# Patient Record
Sex: Female | Born: 1965 | Race: White | Hispanic: No | Marital: Married | Smoking: Former smoker
Health system: Southern US, Community
[De-identification: ages and names within clinical notes are randomized; demographics above are authoritative.]

## PROBLEM LIST (undated history)

## (undated) DIAGNOSIS — R51 Headache: Secondary | ICD-10-CM

## (undated) DIAGNOSIS — G43909 Migraine, unspecified, not intractable, without status migrainosus: Secondary | ICD-10-CM

## (undated) DIAGNOSIS — M1990 Unspecified osteoarthritis, unspecified site: Secondary | ICD-10-CM

## (undated) DIAGNOSIS — M419 Scoliosis, unspecified: Secondary | ICD-10-CM

## (undated) DIAGNOSIS — I10 Essential (primary) hypertension: Secondary | ICD-10-CM

## (undated) DIAGNOSIS — G4730 Sleep apnea, unspecified: Secondary | ICD-10-CM

## (undated) DIAGNOSIS — Z9889 Other specified postprocedural states: Secondary | ICD-10-CM

## (undated) DIAGNOSIS — N390 Urinary tract infection, site not specified: Secondary | ICD-10-CM

## (undated) DIAGNOSIS — E785 Hyperlipidemia, unspecified: Secondary | ICD-10-CM

## (undated) DIAGNOSIS — R519 Headache, unspecified: Secondary | ICD-10-CM

## (undated) DIAGNOSIS — B019 Varicella without complication: Secondary | ICD-10-CM

## (undated) DIAGNOSIS — E039 Hypothyroidism, unspecified: Secondary | ICD-10-CM

## (undated) DIAGNOSIS — R112 Nausea with vomiting, unspecified: Secondary | ICD-10-CM

## (undated) HISTORY — DX: Headache, unspecified: R51.9

## (undated) HISTORY — PX: TUBAL LIGATION: SHX77

## (undated) HISTORY — DX: Migraine, unspecified, not intractable, without status migrainosus: G43.909

## (undated) HISTORY — DX: Essential (primary) hypertension: I10

## (undated) HISTORY — PX: CHOLECYSTECTOMY: SHX55

## (undated) HISTORY — DX: Hyperlipidemia, unspecified: E78.5

## (undated) HISTORY — DX: Varicella without complication: B01.9

## (undated) HISTORY — DX: Headache: R51

## (undated) HISTORY — DX: Urinary tract infection, site not specified: N39.0

---

## 1998-09-10 HISTORY — PX: OTHER SURGICAL HISTORY: SHX169

## 2015-04-19 ENCOUNTER — Ambulatory Visit (INDEPENDENT_AMBULATORY_CARE_PROVIDER_SITE_OTHER): Payer: BLUE CROSS/BLUE SHIELD | Admitting: Family Medicine

## 2015-04-19 ENCOUNTER — Encounter: Payer: Self-pay | Admitting: Family Medicine

## 2015-04-19 ENCOUNTER — Encounter (INDEPENDENT_AMBULATORY_CARE_PROVIDER_SITE_OTHER): Payer: Self-pay

## 2015-04-19 VITALS — BP 110/82 | HR 73 | Temp 98.9°F | Ht 67.25 in | Wt 252.1 lb

## 2015-04-19 DIAGNOSIS — M1710 Unilateral primary osteoarthritis, unspecified knee: Secondary | ICD-10-CM | POA: Insufficient documentation

## 2015-04-19 DIAGNOSIS — E785 Hyperlipidemia, unspecified: Secondary | ICD-10-CM | POA: Insufficient documentation

## 2015-04-19 DIAGNOSIS — I10 Essential (primary) hypertension: Secondary | ICD-10-CM | POA: Diagnosis not present

## 2015-04-19 DIAGNOSIS — E039 Hypothyroidism, unspecified: Secondary | ICD-10-CM | POA: Diagnosis not present

## 2015-04-19 DIAGNOSIS — M170 Bilateral primary osteoarthritis of knee: Secondary | ICD-10-CM

## 2015-04-19 DIAGNOSIS — Z23 Encounter for immunization: Secondary | ICD-10-CM | POA: Diagnosis not present

## 2015-04-19 DIAGNOSIS — E669 Obesity, unspecified: Secondary | ICD-10-CM

## 2015-04-19 DIAGNOSIS — Z0000 Encounter for general adult medical examination without abnormal findings: Secondary | ICD-10-CM | POA: Insufficient documentation

## 2015-04-19 DIAGNOSIS — M179 Osteoarthritis of knee, unspecified: Secondary | ICD-10-CM | POA: Insufficient documentation

## 2015-04-19 DIAGNOSIS — N920 Excessive and frequent menstruation with regular cycle: Secondary | ICD-10-CM | POA: Diagnosis not present

## 2015-04-19 MED ORDER — ZOSTER VACCINE LIVE 19400 UNT/0.65ML ~~LOC~~ SOLR: 0.6500 mL | Freq: Once | SUBCUTANEOUS | Status: DC

## 2015-04-19 NOTE — Progress Notes (Signed)
Pre visit review using our clinic review tool, if applicable. No additional management support is needed unless otherwise documented below in the visit note. 

## 2015-04-19 NOTE — Patient Instructions (Addendum)
It was nice to see you today.  We will call with the results of your labs.  Continue your current medications.  Be cautious regarding long term use of Celebrex.  Be sure to follow up with me in 6 months.  I will place a referral to Gyn.  Take care  Dr. Lacinda Axon  Health Maintenance Adopting a healthy lifestyle and getting preventive care can go a long way to promote health and wellness. Talk with your health care provider about what schedule of regular examinations is right for you. This is a good chance for you to check in with your provider about disease prevention and staying healthy. In between checkups, there are plenty of things you can do on your own. Experts have done a lot of research about which lifestyle changes and preventive measures are most likely to keep you healthy. Ask your health care provider for more information. WEIGHT AND DIET  Eat a healthy diet  Be sure to include plenty of vegetables, fruits, low-fat dairy products, and lean protein.  Do not eat a lot of foods high in solid fats, added sugars, or salt.  Get regular exercise. This is one of the most important things you can do for your health.  Most adults should exercise for at least 150 minutes each week. The exercise should increase your heart rate and make you sweat (moderate-intensity exercise).  Most adults should also do strengthening exercises at least twice a week. This is in addition to the moderate-intensity exercise.  Maintain a healthy weight  Body mass index (BMI) is a measurement that can be used to identify possible weight problems. It estimates body fat based on height and weight. Your health care provider can help determine your BMI and help you achieve or maintain a healthy weight.  For females 14 years of age and older:   A BMI below 18.5 is considered underweight.  A BMI of 18.5 to 24.9 is normal.  A BMI of 25 to 29.9 is considered overweight.  A BMI of 30 and above is considered  obese.  Watch levels of cholesterol and blood lipids  You should start having your blood tested for lipids and cholesterol at 50 years of age, then have this test every 5 years.  You may need to have your cholesterol levels checked more often if:  Your lipid or cholesterol levels are high.  You are older than 50 years of age.  You are at high risk for heart disease.  CANCER SCREENING   Lung Cancer  Lung cancer screening is recommended for adults 70-72 years old who are at high risk for lung cancer because of a history of smoking.  A yearly low-dose CT scan of the lungs is recommended for people who:  Currently smoke.  Have quit within the past 15 years.  Have at least a 30-pack-year history of smoking. A pack year is smoking an average of one pack of cigarettes a day for 1 year.  Yearly screening should continue until it has been 15 years since you quit.  Yearly screening should stop if you develop a health problem that would prevent you from having lung cancer treatment.  Breast Cancer  Practice breast self-awareness. This means understanding how your breasts normally appear and feel.  It also means doing regular breast self-exams. Let your health care provider know about any changes, no matter how small.  If you are in your 20s or 30s, you should have a clinical breast exam (CBE) by a  a health care provider every 1-3 years as part of a regular health exam.  If you are 40 or older, have a CBE every year. Also consider having a breast X-ray (mammogram) every year.  If you have a family history of breast cancer, talk to your health care provider about genetic screening.  If you are at high risk for breast cancer, talk to your health care provider about having an MRI and a mammogram every year.  Breast cancer gene (BRCA) assessment is recommended for women who have family members with BRCA-related cancers. BRCA-related cancers  include:  Breast.  Ovarian.  Tubal.  Peritoneal cancers.  Results of the assessment will determine the need for genetic counseling and BRCA1 and BRCA2 testing. Cervical Cancer Routine pelvic examinations to screen for cervical cancer are no longer recommended for nonpregnant women who are considered low risk for cancer of the pelvic organs (ovaries, uterus, and vagina) and who do not have symptoms. A pelvic examination may be necessary if you have symptoms including those associated with pelvic infections. Ask your health care provider if a screening pelvic exam is right for you.   The Pap test is the screening test for cervical cancer for women who are considered at risk.  If you had a hysterectomy for a problem that was not cancer or a condition that could lead to cancer, then you no longer need Pap tests.  If you are older than 65 years, and you have had normal Pap tests for the past 10 years, you no longer need to have Pap tests.  If you have had past treatment for cervical cancer or a condition that could lead to cancer, you need Pap tests and screening for cancer for at least 20 years after your treatment.  If you no longer get a Pap test, assess your risk factors if they change (such as having a new sexual partner). This can affect whether you should start being screened again.  Some women have medical problems that increase their chance of getting cervical cancer. If this is the case for you, your health care provider may recommend more frequent screening and Pap tests.  The human papillomavirus (HPV) test is another test that may be used for cervical cancer screening. The HPV test looks for the virus that can cause cell changes in the cervix. The cells collected during the Pap test can be tested for HPV.  The HPV test can be used to screen women 30 years of age and older. Getting tested for HPV can extend the interval between normal Pap tests from three to five years.  An HPV  test also should be used to screen women of any age who have unclear Pap test results.  After 50 years of age, women should have HPV testing as often as Pap tests.  Colorectal Cancer  This type of cancer can be detected and often prevented.  Routine colorectal cancer screening usually begins at 50 years of age and continues through 50 years of age.  Your health care provider may recommend screening at an earlier age if you have risk factors for colon cancer.  Your health care provider may also recommend using home test kits to check for hidden blood in the stool.  A small camera at the end of a tube can be used to examine your colon directly (sigmoidoscopy or colonoscopy). This is done to check for the earliest forms of colorectal cancer.  Routine screening usually begins at age 50.  Direct examination   of the colon should be repeated every 5-10 years through 50 years of age. However, you may need to be screened more often if early forms of precancerous polyps or small growths are found. Skin Cancer  Check your skin from head to toe regularly.  Tell your health care provider about any new moles or changes in moles, especially if there is a change in a mole's shape or color.  Also tell your health care provider if you have a mole that is larger than the size of a pencil eraser.  Always use sunscreen. Apply sunscreen liberally and repeatedly throughout the day.  Protect yourself by wearing long sleeves, pants, a wide-brimmed hat, and sunglasses whenever you are outside. HEART DISEASE, DIABETES, AND HIGH BLOOD PRESSURE   Have your blood pressure checked at least every 1-2 years. High blood pressure causes heart disease and increases the risk of stroke.  If you are between 55 years and 79 years old, ask your health care provider if you should take aspirin to prevent strokes.  Have regular diabetes screenings. This involves taking a blood sample to check your fasting blood sugar  level.  If you are at a normal weight and have a low risk for diabetes, have this test once every three years after 50 years of age.  If you are overweight and have a high risk for diabetes, consider being tested at a younger age or more often. PREVENTING INFECTION  Hepatitis B  If you have a higher risk for hepatitis B, you should be screened for this virus. You are considered at high risk for hepatitis B if:  You were born in a country where hepatitis B is common. Ask your health care provider which countries are considered high risk.  Your parents were born in a high-risk country, and you have not been immunized against hepatitis B (hepatitis B vaccine).  You have HIV or AIDS.  You use needles to inject street drugs.  You live with someone who has hepatitis B.  You have had sex with someone who has hepatitis B.  You get hemodialysis treatment.  You take certain medicines for conditions, including cancer, organ transplantation, and autoimmune conditions. Hepatitis C  Blood testing is recommended for:  Everyone born from 1945 through 1965.  Anyone with known risk factors for hepatitis C. Sexually transmitted infections (STIs)  You should be screened for sexually transmitted infections (STIs) including gonorrhea and chlamydia if:  You are sexually active and are younger than 50 years of age.  You are older than 50 years of age and your health care provider tells you that you are at risk for this type of infection.  Your sexual activity has changed since you were last screened and you are at an increased risk for chlamydia or gonorrhea. Ask your health care provider if you are at risk.  If you do not have HIV, but are at risk, it may be recommended that you take a prescription medicine daily to prevent HIV infection. This is called pre-exposure prophylaxis (PrEP). You are considered at risk if:  You are sexually active and do not regularly use condoms or know the HIV status  of your partner(s).  You take drugs by injection.  You are sexually active with a partner who has HIV. Talk with your health care provider about whether you are at high risk of being infected with HIV. If you choose to begin PrEP, you should first be tested for HIV. You should then be tested every   3 months for as long as you are taking PrEP.  PREGNANCY   If you are premenopausal and you may become pregnant, ask your health care provider about preconception counseling.  If you may become pregnant, take 400 to 800 micrograms (mcg) of folic acid every day.  If you want to prevent pregnancy, talk to your health care provider about birth control (contraception). OSTEOPOROSIS AND MENOPAUSE   Osteoporosis is a disease in which the bones lose minerals and strength with aging. This can result in serious bone fractures. Your risk for osteoporosis can be identified using a bone density scan.  If you are 65 years of age or older, or if you are at risk for osteoporosis and fractures, ask your health care provider if you should be screened.  Ask your health care provider whether you should take a calcium or vitamin D supplement to lower your risk for osteoporosis.  Menopause may have certain physical symptoms and risks.  Hormone replacement therapy may reduce some of these symptoms and risks. Talk to your health care provider about whether hormone replacement therapy is right for you.  HOME CARE INSTRUCTIONS   Schedule regular health, dental, and eye exams.  Stay current with your immunizations.   Do not use any tobacco products including cigarettes, chewing tobacco, or electronic cigarettes.  If you are pregnant, do not drink alcohol.  If you are breastfeeding, limit how much and how often you drink alcohol.  Limit alcohol intake to no more than 1 drink per day for nonpregnant women. One drink equals 12 ounces of beer, 5 ounces of wine, or 1 ounces of hard liquor.  Do not use street  drugs.  Do not share needles.  Ask your health care provider for help if you need support or information about quitting drugs.  Tell your health care provider if you often feel depressed.  Tell your health care provider if you have ever been abused or do not feel safe at home. Document Released: 03/12/2011 Document Revised: 01/11/2014 Document Reviewed: 07/29/2013 [employer] Patient Information 2015 [employer]. This information is not intended to replace advice given to you by your health care provider. Make sure you discuss any questions you have with your health care provider.  

## 2015-04-19 NOTE — Assessment & Plan Note (Signed)
Obtaining laboratory studies today: CBC, CMP, TSH, lipid panel, A1c. Tdap given today. Patient requested Zostavax. Rx given. Patient up-to-date on mammogram as well as Pap smear. Patient was to hold off on colonoscopy screening at this time. HIV screening performed previously.

## 2015-04-19 NOTE — Assessment & Plan Note (Signed)
Obtaining lipid panel today. 

## 2015-04-19 NOTE — Assessment & Plan Note (Signed)
Stable Celebrex. Checking CBC and metabolic panel today. Cautioned patient on chronic use as it may lead to GI bleed and potential adverse cardiac vascular outcomes.

## 2015-04-19 NOTE — Assessment & Plan Note (Signed)
Well-controlled on losartan and labetalol. Obtaining metabolic panel today. I discussed use of labetalol with patient today. I informed her that there are new medications that are more efficacious and once a day. Patient states that the labetalol works well for her and prevents headaches. Will continue both these medications.

## 2015-04-19 NOTE — Assessment & Plan Note (Signed)
Referring to GYN. 

## 2015-04-19 NOTE — Progress Notes (Signed)
Subjective:  Patient ID: Annette Valencia, female    DOB: 1966/08/14  Age: 49 y.o. MRN: [id number]  CC: Establish care  HPI Annette Valencia is a 49 year old female presents to the clinic today to establish care. Issues and concerns are outlined below.  1) Preventative health care  Pap smear: Last Pap smear was in 2015.  Mammogram/Breast exam: Last mammogram was in 2015.  Colonoscopy: Patient in need of colonoscopy.   Immunizations: Patient's last tetanus immunization was in 2000. She needs a Tdap today.  Labs: In need of A1c, lipid, metabolic panel and CBC today.  Diet/Exercise: Poor diet and does not exercise.  Smoking/tobacco use: Former smoker.  STD/HIV testing: Patient states that HIV screening was done previously.  2) HLD  Unsure of control.  Has not had lab work in ~ 1 year.  Currently on no medication for this.  3) HTN  Well controlled.   Medications - Labetalol and Losartan.  Compliance - Yes.   ROS: Denies chest pain, SOB, lightheadedness/dizziness   4) Hypothyroidism  Well controlled on Synthroid.  5) Knee OA  Stable on Celebrex  She has been on this for ~ 1 year.   No reports of GI upset, abdominal pain, recent blood in stool.   6) Menorrhagia  Patient reports a one-year history of menorrhagia.  Cycles are heavy for 3-4 days.  She reports passage of large clots.  Additionally, she reports associated severe low back pain.  She has [place] by Gyn [place] and would like referral today.  PMH, Surgical Hx, Family Hx, Social History reviewed and updated as below. Past Medical History  Diagnosis Date  . Chicken pox   . Frequent headaches   . Hyperlipidemia   . Migraines   . UTI (urinary tract infection)     Past Surgical History  Procedure Laterality Date  . Tubes tied  2000  . Cesarean section  1988    Family History  Problem Relation Age of Onset  . Arthritis Mother   . Cancer Mother     lung  .  Hyperlipidemia Mother   . Alcohol abuse Father   . Stroke Father   . Hypertension Father   . ALS Father   . Hypertension Maternal Grandmother     History  Substance Use Topics  . Smoking status: Former Smoker    Quit date: 04/18/1992  . Smokeless tobacco: Never Used  . Alcohol Use: 6.0 oz/week    10 Standard drinks or equivalent per week     Comment: wine    Review of Systems  Constitutional: Negative for fever and chills.  HENT: Negative.   Eyes: Negative.  Negative for visual disturbance.  Respiratory: Negative for chest tightness and shortness of breath.   Cardiovascular: Negative for chest pain.  Gastrointestinal: Negative for nausea, vomiting and diarrhea.  Endocrine: Negative.   Genitourinary: Negative.   Musculoskeletal: Positive for back pain.  Skin: Negative for rash.  Psychiatric/Behavioral:       Denies depression.    Objective:   Today's Vitals: BP 110/82 mmHg  Pulse 73  Temp(Src) 98.9 F (37.2 C) (Oral)  Ht 5' 7.25" (1.708 m)  Wt 252 lb 2 oz (114.363 kg)  BMI 39.20 kg/m2  SpO2 95%  LMP 03/25/2015  Physical Exam  Constitutional: She is oriented to person, place, and time. She appears well-developed and well-nourished. No distress.  Obese.   HENT:  Head: Normocephalic and atraumatic.  Nose: Nose normal.  Mouth/Throat: Oropharynx is clear  and moist. No oropharyngeal exudate.  Normal TM's bilaterally.   Eyes: Conjunctivae are normal. No scleral icterus.  Neck: Neck supple. No thyromegaly present.  Cardiovascular: Normal rate and regular rhythm.   No murmur heard. Pulmonary/Chest: Effort normal and breath sounds normal. She has no wheezes. She has no rales.  Abdominal: Soft. She exhibits no distension. There is no tenderness. There is no rebound and no guarding.  Musculoskeletal:  Trace edema.   Lymphadenopathy:    She has no cervical adenopathy.  Neurological: She is alert and oriented to person, place, and time.  Skin: Skin is warm and dry. No  rash noted.  Psychiatric: She has a normal mood and affect.  Vitals reviewed.   Assessment & Plan:   Problem List Items Addressed This Visit    Preventative health care    Obtaining laboratory studies today: CBC, CMP, TSH, lipid panel, A1c. Tdap given today. Patient requested Zostavax. Rx given. Patient up-to-date on mammogram as well as Pap smear. Patient was to hold off on colonoscopy screening at this time. HIV screening performed previously.      Menorrhagia    Referring to GYN.      Relevant Orders   CBC   Ambulatory referral to Gynecology   Essential hypertension    Well-controlled on losartan and labetalol. Obtaining metabolic panel today. I discussed use of labetalol with patient today. I informed her that there are new medications that are more efficacious and once a day. Patient states that the labetalol works well for her and prevents headaches. Will continue both these medications.      Relevant Medications   labetalol (NORMODYNE) 200 MG tablet   losartan (COZAAR) 100 MG tablet   Other Relevant Orders   Comprehensive metabolic panel   Lipid Profile   HgB A1c   HLD (hyperlipidemia)    Obtaining lipid panel today.      Relevant Medications   labetalol (NORMODYNE) 200 MG tablet   losartan (COZAAR) 100 MG tablet   Hypothyroidism    Stable. TSH today. Continue synthroid.       Relevant Medications   labetalol (NORMODYNE) 200 MG tablet   levothyroxine (SYNTHROID, LEVOTHROID) 75 MCG tablet   Other Relevant Orders   TSH   Knee osteoarthritis    Stable Celebrex. Checking CBC and metabolic panel today. Cautioned patient on chronic use as it may lead to GI bleed and potential adverse cardiac vascular outcomes.      Relevant Medications   celecoxib (CELEBREX) 200 MG capsule    Other Visit Diagnoses    Need for Tdap vaccination    -  Primary    Relevant Orders    Tdap vaccine greater than or equal to 7yo IM (Completed)    Obesity (BMI 30-39.9)          Relevant Orders    Comprehensive metabolic panel    Lipid Profile    HgB A1c       Outpatient Encounter Prescriptions as of 04/19/2015  Medication Sig  . celecoxib (CELEBREX) 200 MG capsule Take 200 mg by mouth once.  . fexofenadine (ALLEGRA) 180 MG tablet Take 180 mg by mouth daily.  Marland Kitchen labetalol (NORMODYNE) 200 MG tablet Take 200 mg by mouth 2 (two) times daily.  Marland Kitchen levothyroxine (SYNTHROID, LEVOTHROID) 75 MCG tablet Take 0.75 mcg by mouth once.  Marland Kitchen losartan (COZAAR) 100 MG tablet Take 100 mg by mouth once.  . zoster vaccine live, PF, (ZOSTAVAX) 16109 UNT/0.65ML injection Inject 19,400 Units into the skin  once.   No facility-administered encounter medications on file as of 04/19/2015.    Follow-up: Return in about 6 months (around 10/20/2015).    Tommie Sams MD

## 2015-04-19 NOTE — Assessment & Plan Note (Signed)
Stable. TSH today. Continue synthroid.

## 2015-04-20 LAB — COMPREHENSIVE METABOLIC PANEL
ALT: 21 U/L (ref 0–35)
AST: 20 U/L (ref 0–37)
Albumin: 4.1 g/dL (ref 3.5–5.2)
Alkaline Phosphatase: 74 U/L (ref 39–117)
BUN: 18 mg/dL (ref 6–23)
CO2: 28 mEq/L (ref 19–32)
Calcium: 9.8 mg/dL (ref 8.4–10.5)
Chloride: 103 mEq/L (ref 96–112)
Creatinine, Ser: 0.94 mg/dL (ref 0.40–1.20)
GFR: 66.93 mL/min (ref >60.00)
Glucose, Bld: 83 mg/dL (ref 70–99)
POTASSIUM: 3.8 meq/L (ref 3.5–5.1)
SODIUM: 140 meq/L (ref 135–145)
Total Bilirubin: 0.4 mg/dL (ref 0.2–1.2)
Total Protein: 6.9 g/dL (ref 6.0–8.3)

## 2015-04-20 LAB — CBC
HEMATOCRIT: 36 % (ref 36.0–46.0)
HEMOGLOBIN: 12.2 g/dL (ref 12.0–15.0)
MCHC: 33.9 g/dL (ref 30.0–36.0)
MCV: 87.4 fl (ref 78.0–100.0)
PLATELETS: 238 10*3/uL (ref 150.0–400.0)
RBC: 4.12 Mil/uL (ref 3.87–5.11)
RDW: 13.1 % (ref 11.5–15.5)
WBC: 7.2 10*3/uL (ref 4.0–10.5)

## 2015-04-20 LAB — LIPID PANEL
Cholesterol: 285 mg/dL — ABNORMAL HIGH (ref 0–200)
HDL: 55.6 mg/dL (ref >39.00)
NonHDL: 229.69
Total CHOL/HDL Ratio: 5
Triglycerides: 338 mg/dL — ABNORMAL HIGH (ref 0.0–149.0)
VLDL: 67.6 mg/dL — ABNORMAL HIGH (ref 0.0–40.0)

## 2015-04-20 LAB — LDL CHOLESTEROL, DIRECT: Direct LDL: 185 mg/dL

## 2015-04-20 LAB — HEMOGLOBIN A1C: HEMOGLOBIN A1C: 5.4 % (ref 4.6–6.5)

## 2015-04-20 LAB — TSH: TSH: 2.2 u[IU]/mL (ref 0.35–4.50)

## 2015-06-20 ENCOUNTER — Encounter: Payer: Self-pay | Admitting: Podiatry

## 2015-06-20 ENCOUNTER — Ambulatory Visit (INDEPENDENT_AMBULATORY_CARE_PROVIDER_SITE_OTHER): Payer: BLUE CROSS/BLUE SHIELD | Admitting: Podiatry

## 2015-06-20 VITALS — BP 115/78 | HR 64 | Resp 12

## 2015-06-20 DIAGNOSIS — L600 Ingrowing nail: Secondary | ICD-10-CM | POA: Diagnosis not present

## 2015-06-20 MED ORDER — NEOMYCIN-POLYMYXIN-HC 3.5-10000-1 OT SOLN: OTIC | Status: DC

## 2015-06-20 NOTE — Patient Instructions (Signed)

## 2015-06-20 NOTE — Progress Notes (Signed)
   Subjective:    Patient ID: Annette Valencia, female    DOB: [birth year], 49 y.o.   MRN: [id number]  HPI: She presents today with a chief complaint of painful ingrown toenails left greater than right. She states that I had this problem for many years and is tired of digging them out.    Review of Systems  Constitutional: Positive for fatigue and unexpected weight change.  HENT: Positive for sinus pressure.        Objective:   Physical Exam: 49 year old female no distress vital signs stable alert and oriented 3. Pulses are strongly palpable. Neurologic sensorium is intact per Semmes-Weinstein monofilament. Deep tendon reflexes are intact bilateral and muscle strength +5 over 5 dorsiflexors plantar flexors inverters and everters all intrinsic musculature is intact. Orthopedic evaluation demonstrates all joints distal to the ankle and full range of motion without crepitation. Cutaneous evaluation demonstrates supple well-hydrated cutis sharply incurvated nail margins along the tibial fibular border of the hallux bilateral erythema along the tibial borders with pain. Abscess hallux left.        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  Plan: Discussed etiology pathology conservative versus surgical therapies. At this point we had decided to perform a chemical matrixectomy to the tibial borders of the hallux bilateral. She tolerated this procedure well after local anesthesia was administered. Matrixectomy is performed to the tibial borders only. She was given both oral and read home-going instructions for care and soaking of her toes as well as a prescription for Cortisporin Otic. I will follow-up with her in 1 week.

## 2015-06-27 ENCOUNTER — Encounter: Payer: Self-pay | Admitting: Podiatry

## 2015-06-27 ENCOUNTER — Ambulatory Visit (INDEPENDENT_AMBULATORY_CARE_PROVIDER_SITE_OTHER): Payer: BLUE CROSS/BLUE SHIELD | Admitting: Podiatry

## 2015-06-27 DIAGNOSIS — L600 Ingrowing nail: Secondary | ICD-10-CM

## 2015-06-27 NOTE — Progress Notes (Signed)
She presents today for follow-up of her matrixectomy hallux bilateral. States that she's been soaking in Epsom salts and water for the past couple of days after discontinuing Betadine. She states she's doing quite well and toes don't hurt.  Objective: No erythema edema saline as drainage or odor. Pulses remain palpable bilateral.  Assessment: Well healing matrixectomy hallux bilateral.  Plan: continue soaking Epsom salts and warm water until completely resolved. Leave open at night time but covered during the daytime. Continue the use of Cortisporin Otic.

## 2015-07-26 ENCOUNTER — Ambulatory Visit (INDEPENDENT_AMBULATORY_CARE_PROVIDER_SITE_OTHER): Payer: BLUE CROSS/BLUE SHIELD | Admitting: Sports Medicine

## 2015-07-26 DIAGNOSIS — B070 Plantar wart: Secondary | ICD-10-CM | POA: Diagnosis not present

## 2015-07-26 DIAGNOSIS — L600 Ingrowing nail: Secondary | ICD-10-CM

## 2015-07-26 DIAGNOSIS — M79672 Pain in left foot: Secondary | ICD-10-CM

## 2015-07-26 DIAGNOSIS — M79671 Pain in right foot: Secondary | ICD-10-CM

## 2015-07-26 NOTE — Progress Notes (Addendum)
Patient ID: Annette GoodellKaren Brunkow, female   DOB: Oct 09, 1965, 49 y.o.   MRN: [id number] Subjective: Annette GoodellKaren Emanuele is a 49 y.o.  female patient returns to office today for follow up evaluation after having Bilateral Hallux medial permanent nail avulsion performed on 06-20-15. Patient has been soaking using epsom salt and applying topical antibiotic covered with bandaid daily during the day.Patient states that she is concerned because it has been over a month and the left big toe is still red and tender. Patient admits to a callus or wart lesion on the right heel that she wants treatment for. Patient deniesfever/chills/nausea/vomitting/any other related constitutional symptoms at this time.  Objective:   General: Well developed, nourished, in no acute distress, alert and oriented x3   Dermatology: Skin is warm, dry and supple bilateral. Bilateral hallux medial nail bed appears to be clean, dry, with mild granular tissue and surrounding eschar/scab. (+) Erythema at proximal nail fold at left hallux likely from trapped drainage and chemical application during the avulsion procedure 1 months ago. right hallux no erythema. (-) Edema. (-) serosanguous drainage present. The remaining nails appear unremarkable at this time. To right heel there are 2 keratotic lesions with mild tenderness with medial to lateral pressure and pinpoint bleeding consistent with wart. There are no other lesions or other signs of infection present.  Neurovascular status: Intact. No lower extremity swelling; No pain with calf compression bilateral.  Musculoskeletal: Decreased tenderness to palpation of the bilateral hallux nail fold(s). Mild tenderness to right heel plantar warts with medial to lateral pressure. Muscular strength within normal limits bilateral.   Assesement and Plan: Problem List Items Addressed This Visit    None    Visit Diagnoses    Ingrown toenail    -  Primary    S/p PNA bilateral hallux medial margins L>R     Foot pain, bilateral        Plantar wart of right foot          -Examined patient  -Cleansed left hallux medial nail fold and gently scrubbed with peroxide and curetted away eschar at site and applied triple antibiotic covered with bandaid.  -Discussed plan of care with patient. -Patient to now begin soaking in a weak solution of Epsom salt and warm water. Patient was instructed to soak for 15-20 minutes each day until the toe appears normal and there is no drainage, redness, tenderness, or swelling at the procedure site, and apply otic solution or neosporin and a gauze or bandaid dressing each day as needed. May leave open to air at night. -Educated patient on long term care after nail surgery. -Patient was instrcuted to monitor the toe for reoccurrence and signs of infection; Patient advised to return to office if toe becomes red, hot or swollen. -To right heel debrided warts x 2 using sterile chisel blade and applied Canthrodine under occulsion. Advised patient on blistering reaction that may occur. Re-dress with neosporin and bandaid; will closely monitor for reoccurrence may need multiple chemical treatments until warts are gone.  -Patient is to return in 2 weeks for follow up evaluation or sooner if problems arise.  Asencion Islamitorya Dee Maday, DPM

## 2015-08-08 ENCOUNTER — Telehealth: Payer: Self-pay | Admitting: Sports Medicine

## 2015-08-08 NOTE — Telephone Encounter (Signed)
Called patient to discuss her concerns; patient called office earlier today regarding infection in her toe. Patient reports that on Wednesday she went to Urgent Care for infection at the site of her previous ingrown nail removal; patient states that she had puss coming from the area of which now is better. Patient states that now there is bloody drainage of which she is soaking using Epsom salt and covering with bandaid during the day and at night. Advised patient to closely monitor site for signs of infection and to monitor systemically for signs of infection. Advised patient to continue with soaking and covering with bandaid. Patient expressed understanding and is to follow up on Friday 08-12-15 for follow up care.

## 2015-08-12 ENCOUNTER — Encounter: Payer: Self-pay | Admitting: Sports Medicine

## 2015-08-12 ENCOUNTER — Ambulatory Visit (INDEPENDENT_AMBULATORY_CARE_PROVIDER_SITE_OTHER): Payer: BLUE CROSS/BLUE SHIELD | Admitting: Sports Medicine

## 2015-08-12 DIAGNOSIS — M79672 Pain in left foot: Secondary | ICD-10-CM | POA: Diagnosis not present

## 2015-08-12 DIAGNOSIS — M79671 Pain in right foot: Secondary | ICD-10-CM | POA: Diagnosis not present

## 2015-08-12 DIAGNOSIS — L600 Ingrowing nail: Secondary | ICD-10-CM | POA: Diagnosis not present

## 2015-08-12 MED ORDER — CEPHALEXIN 500 MG PO CAPS: 500.0000 mg | ORAL_CAPSULE | Freq: Two times a day (BID) | ORAL | Status: DC

## 2015-08-13 NOTE — Progress Notes (Signed)
Patient ID: Annette GoodellKaren Linford, female   DOB: Aug 20, 1966, 49 y.o.   MRN: [id number] Subjective: Annette GoodellKaren Galla is a 49 y.o.  female patient returns to office today for follow up evaluation after having Bilateral Hallux medial permanent nail avulsion performed on 06-20-15. Patient > 1 week ago went to urgent care for antibiotics because the left big toe started to drain more and appear infected. Patient has been soaking using epsom salt and covering with bandaid daily during the day.Patient states that since being on antibiotic the redness is better and drainage is less. Patient denies fever/chills/nausea/vomitting/any other related constitutional symptoms at this time.  Objective:   General: Well developed, nourished, in no acute distress, alert and oriented x3   Dermatology: Skin is warm, dry and supple bilateral. Right medial hallux nail bed healed. Left medial hallux nail bed has beefy hypergranular tissue with redness at proximal nail fold and distal hallux tuft with mild Edema. (-) serosanguous drainage present. The remaining nails appear unremarkable at this time. Wart sites on right heel appear stable. There are no other lesions or other signs of infection present.  Neurovascular status: Intact. No lower extremity swelling; No pain with calf compression bilateral.  Musculoskeletal: Decreased tenderness to palpation of the bilateral hallux nail fold(s). Muscular strength within normal limits bilateral.   Assesement and Plan: Problem List Items Addressed This Visit    None    Visit Diagnoses    Ingrown nail    -  Primary    s/p PNA    Foot pain, bilateral        right resolved. left improving at hallux medial nail bed      -Examined patient  -Right hallux medial nail fold healed -Cleansed left hallux medial nail fold and gently scrubbed with peroxide and curetted away hypergranular tissue and applied silver nitrate, triple antibiotic covered with bandaid.  -Discussed plan of care with  patient. -Patient to cont, soaking in a weak solution of Epsom salt and warm water 1x daily now anticipate 1 week. Patient was instructed to soak for 15-20 minutes each day until the toe appears normal and there is no drainage, redness, tenderness, or swelling at the procedure site, and apply otic solution or neosporin and a gauze or bandaid dressing each day as needed. May leave open to air at night. -Educated patient on long term care after nail surgery. -Patient was instrcuted to monitor the toe for reoccurrence and signs of infection; Patient advised to return to office if toe becomes red, hot or swollen. Refilled Keflex for patient to have in case it is needed. -Right heel wart sites stable; will closely monitor for reoccurrence may need multiple chemical treatments until warts are gone.  -Patient is to return in 2 weeks for follow up evaluation or sooner if problems arise.  Asencion Islamitorya Terisa Belardo, DPM

## 2015-08-22 ENCOUNTER — Other Ambulatory Visit: Payer: Self-pay | Admitting: Family Medicine

## 2015-08-22 NOTE — Telephone Encounter (Signed)
Ok to fill 

## 2015-08-26 ENCOUNTER — Ambulatory Visit (INDEPENDENT_AMBULATORY_CARE_PROVIDER_SITE_OTHER): Payer: BLUE CROSS/BLUE SHIELD | Admitting: Sports Medicine

## 2015-08-26 ENCOUNTER — Encounter: Payer: Self-pay | Admitting: Sports Medicine

## 2015-08-26 DIAGNOSIS — M79675 Pain in left toe(s): Secondary | ICD-10-CM | POA: Diagnosis not present

## 2015-08-26 DIAGNOSIS — L03032 Cellulitis of left toe: Secondary | ICD-10-CM

## 2015-08-26 DIAGNOSIS — L02612 Cutaneous abscess of left foot: Secondary | ICD-10-CM | POA: Diagnosis not present

## 2015-08-26 DIAGNOSIS — L600 Ingrowing nail: Secondary | ICD-10-CM

## 2015-08-26 MED ORDER — CEPHALEXIN 500 MG PO CAPS: 500.0000 mg | ORAL_CAPSULE | Freq: Two times a day (BID) | ORAL | Status: DC

## 2015-08-26 NOTE — Progress Notes (Signed)
Patient ID: Annette GoodellKaren Cousins, female   DOB: [birth year], 49 y.o.   MRN: [id number]  Subjective: Annette GoodellKaren Mousel is a 49 y.o.  female patient returns to office today for follow up evaluation of Left Hallux medial permanent nail avulsion site, performed on 06-20-15. Patient has been soaking using epsom salt and covering with bandaid daily during the day.Patient states that since being on antibiotic the redness is better and drainage is less; finally stopped on yesterday. Patient denies fever/chills/nausea/vomitting/any other related constitutional symptoms at this time.  Objective:   General: Well developed, nourished, in no acute distress, alert and oriented x3   Dermatology: Skin is warm, dry and supple bilateral. Left medial hallux nail bed has remaining beefy hypergranular tissue with redness at proximal nail fold and distal hallux tuft with mild Edema. (-) serosanguous drainage present. The remaining nails appear unremarkable at this time.  There are no other lesions or other signs of infection present.  Neurovascular status: Intact. No lower extremity swelling; No pain with calf compression bilateral.  Musculoskeletal: Decreased tenderness to palpation of the left hallux nail fold(s). Muscular strength within normal limits bilateral.   Assesement and Plan: Problem List Items Addressed This Visit    None    Visit Diagnoses    Ingrown nail    -  Primary    s/p PNA 06-20-15 with granuloma reaction and slow healing at Left hallux medial margin     Relevant Medications    cephALEXin (KEFLEX) 500 MG capsule    Cellulitis and abscess of toe, left        improving    Toe pain, left          -Examined patient  -Cleansed left hallux medial nail fold and gently scrubbed with peroxide and excised away hypergranular tissue and applied silver nitrate, triple antibiotic covered with bandaid.  -Discussed plan of care with patient. Recommended to redo PNA if still bothersome at next visit -Patient  to cont, soaking in a weak solution of Epsom salt and warm water 1x daily now anticipate 1 week. Patient was instructed to soak for 15-20 minutes each day until the toe appears normal and there is no drainage, redness, tenderness, or swelling at the procedure site, and apply otic solution or neosporin and a gauze or bandaid dressing each day as needed. May leave open to air at night. -Educated patient on long term care after nail surgery. -Patient was instrcuted to monitor the toe for reoccurrence and signs of infection; Patient advised to return to office if toe becomes red, hot or swollen. Refilled Keflex for patient to have in case it is needed. -Patient is to return in one week after she returns from her out-of-town trip for the holiday for follow up evaluation or sooner if problems arise.  Asencion Islamitorya Shanesha Bednarz, DPM

## 2015-09-06 ENCOUNTER — Ambulatory Visit (INDEPENDENT_AMBULATORY_CARE_PROVIDER_SITE_OTHER): Payer: BLUE CROSS/BLUE SHIELD | Admitting: Sports Medicine

## 2015-09-06 ENCOUNTER — Encounter: Payer: Self-pay | Admitting: Sports Medicine

## 2015-09-06 DIAGNOSIS — L02612 Cutaneous abscess of left foot: Secondary | ICD-10-CM | POA: Diagnosis not present

## 2015-09-06 DIAGNOSIS — L03032 Cellulitis of left toe: Secondary | ICD-10-CM | POA: Diagnosis not present

## 2015-09-06 DIAGNOSIS — M79675 Pain in left toe(s): Secondary | ICD-10-CM | POA: Diagnosis not present

## 2015-09-06 DIAGNOSIS — L600 Ingrowing nail: Secondary | ICD-10-CM

## 2015-09-06 NOTE — Progress Notes (Signed)
Patient ID: Annette Valencia, female   DOB: 05/29/1966, 49 y.o.   MRN: [id number]  Subjective: Annette Valencia is a 49 y.o.  female patient returns to office today for follow up evaluation of Left Hallux medial permanent nail avulsion site, performed on 06-20-15. Patient has been soaking using epsom salt and covering with bandaid daily during the day.Patient states that since being on antibiotic the redness is better and no drainage; reports that pain is also gone from the site and she finally feels like its healing especially since last visit when the granular tissue and possible nail spicule was removed. Patient has been taking Keflex with no adverse reactions. Patient denies fever/chills/nausea/vomitting/any other related constitutional symptoms at this time.  Patient Active Problem List   Diagnosis Date Noted  . Preventative health care 04/19/2015  . Menorrhagia 04/19/2015  . Essential hypertension 04/19/2015  . HLD (hyperlipidemia) 04/19/2015  . Hypothyroidism 04/19/2015  . Knee osteoarthritis 04/19/2015   Current Outpatient Prescriptions on File Prior to Visit  Medication Sig Dispense Refill  . celecoxib (CELEBREX) 200 MG capsule TAKE 1 CAPSULE BY MOUTH DAILY 30 capsule 3  . cephALEXin (KEFLEX) 500 MG capsule Take 1 capsule (500 mg total) by mouth 2 (two) times daily. 28 capsule 0  . fexofenadine (ALLEGRA) 180 MG tablet Take 180 mg by mouth daily.    Marland Kitchen. labetalol (NORMODYNE) 200 MG tablet Take 200 mg by mouth 2 (two) times daily.  3  . levothyroxine (SYNTHROID, LEVOTHROID) 75 MCG tablet Take 0.75 mcg by mouth once.  3  . losartan (COZAAR) 100 MG tablet Take 100 mg by mouth once.  2  . neomycin-polymyxin-hydrocortisone (CORTISPORIN) otic solution Apply one to two drops to toe after soaking twice daily. 10 mL 0   No current facility-administered medications on file prior to visit.   No Known Allergies   Objective:   General: Well developed, nourished, in no acute distress, alert and  oriented x3   Dermatology: Skin is warm, dry and supple bilateral. Left medial hallux nail bed is dry, clean, with granular tissue with minimal eschar/scabing, there is decreased redness at proximal nail fold and distal hallux tuft with mild Edema that is improving in nature. (-) serosanguous drainage present. The remaining nails appear unremarkable at this time.  There are no other lesions or other signs of infection present.  Neurovascular status: Intact. No lower extremity swelling; No pain with calf compression bilateral.  Musculoskeletal: Decreased tenderness to palpation of the left hallux nail fold(s). Muscular strength within normal limits bilateral.   Assesement and Plan: Problem List Items Addressed This Visit    None    Visit Diagnoses    Ingrown nail    -  Primary    Improving     Cellulitis and abscess of toe, left        Resolved    Toe pain, left        Improved      -Examined patient  -Applied triple antibiotic covered with bandaid to left hallux.  -Discussed plan of care with patient. No Redo PNA warranted at this time since site is much improved and is healing without infection. -Patient to cont, soaking in a weak solution of Epsom salt and warm water 1x daily now anticipate 1 week. Patient was instructed to soak for 15-20 minutes each day until the toe appears normal and there is no drainage, redness, tenderness, or swelling at the procedure site, and apply a gauze or bandaid dressing each day as needed. May  leave open to air at night. -Patient can stop Keflex at this time; advised patient that if redness or signs of infection recurs to resume keflex and come to office immediately -Educated patient on long term care after nail surgery. -Patient was instrcuted to monitor the toe for reoccurrence and signs of infection; Patient advised to return to office if toe becomes red, hot or swollen. -Patient is to return as needed or sooner if problems arise.  Asencion Islam,  DPM

## 2015-09-11 HISTORY — PX: CERVICAL ABLATION: SHX5771

## 2015-10-27 ENCOUNTER — Ambulatory Visit: Payer: BLUE CROSS/BLUE SHIELD | Admitting: Family Medicine

## 2015-11-03 ENCOUNTER — Encounter: Payer: Self-pay | Admitting: Family Medicine

## 2015-11-03 ENCOUNTER — Ambulatory Visit (INDEPENDENT_AMBULATORY_CARE_PROVIDER_SITE_OTHER): Payer: BLUE CROSS/BLUE SHIELD | Admitting: Family Medicine

## 2015-11-03 VITALS — BP 116/78 | HR 82 | Temp 99.6°F | Wt 242.0 lb

## 2015-11-03 DIAGNOSIS — R062 Wheezing: Secondary | ICD-10-CM | POA: Diagnosis not present

## 2015-11-03 DIAGNOSIS — R52 Pain, unspecified: Secondary | ICD-10-CM | POA: Diagnosis not present

## 2015-11-03 DIAGNOSIS — J0100 Acute maxillary sinusitis, unspecified: Secondary | ICD-10-CM | POA: Diagnosis not present

## 2015-11-03 LAB — POCT INFLUENZA A/B
Influenza A, POC: NEGATIVE
Influenza B, POC: NEGATIVE

## 2015-11-03 MED ORDER — ALBUTEROL SULFATE HFA 108 (90 BASE) MCG/ACT IN AERS: 2.0000 | INHALATION_SPRAY | Freq: Four times a day (QID) | RESPIRATORY_TRACT | Status: DC | PRN

## 2015-11-03 MED ORDER — HYDROCODONE-HOMATROPINE 5-1.5 MG/5ML PO SYRP: 5.0000 mL | ORAL_SOLUTION | Freq: Three times a day (TID) | ORAL | Status: DC | PRN

## 2015-11-03 MED ORDER — AMOXICILLIN-POT CLAVULANATE 875-125 MG PO TABS: 1.0000 | ORAL_TABLET | Freq: Two times a day (BID) | ORAL | Status: DC

## 2015-11-03 MED ORDER — ALBUTEROL SULFATE (2.5 MG/3ML) 0.083% IN NEBU
2.5000 mg | INHALATION_SOLUTION | Freq: Once | RESPIRATORY_TRACT | Status: AC
  Administered 2015-11-03: 2.5 mg via RESPIRATORY_TRACT

## 2015-11-03 NOTE — Patient Instructions (Signed)
Nice to meet you. You likely have a sinus infection and bronchitis. We will treat this with Augmentin. She can also use an inhaler every 6 hours as needed for chest congestion and wheezing. If you develop chest pain, shortness of breath, cough productive of blood, fever, or any new or change in symptoms please seek medical attention.

## 2015-11-03 NOTE — Progress Notes (Signed)
Patient ID: Franchesca Veneziano, female   DOB: [birth year], 50 y.o.   MRN: [id number]  Marikay Alar, MD Phone: [phone]  Tiani Stanbery is a 50 y.o. female who presents today for same-day visit.  Patient notes she had cold symptoms last week with sinus and nasal congestion that improved though several days ago she developed worsening sinus and nasal congestion. With sore throat and postnasal drip. Notes her upper lungs feel sore, though has no chest pain. Denies shortness of breath. She is coughing up brownish mucus. Temperature to 100.52F last night. She is blowing mild clear bloody mucus out of her nose. Sick contacts at work.  PMH: Former smoker.   ROS see history of present illness  Objective  Physical Exam Filed Vitals:   11/03/15 1008  BP: 116/78  Pulse: 82  Temp: 99.6 F (37.6 C)    BP Readings from Last 3 Encounters:  11/03/15 116/78  06/20/15 115/78  04/19/15 110/82   Wt Readings from Last 3 Encounters:  11/03/15 242 lb (109.77 kg)  04/19/15 252 lb 2 oz (114.363 kg)    Physical Exam  Constitutional: She is well-developed, well-nourished, and in no distress.  HENT:  Head: Normocephalic and atraumatic.  Right Ear: External ear normal.  Left Ear: External ear normal.  Mild posterior oropharyngeal erythema, no tonsillar exudate or swelling, normal TMs bilaterally  Eyes: Conjunctivae are normal. Pupils are equal, round, and reactive to light.  Neck: Neck supple.  Cardiovascular: Normal rate, regular rhythm and normal heart sounds.  Exam reveals no gallop and no friction rub.   No murmur heard. Pulmonary/Chest: Effort normal and breath sounds normal. No respiratory distress. She has no wheezes. She has no rales.  Lymphadenopathy:    She has no cervical adenopathy.  Neurological: She is alert. Gait normal.  Skin: Skin is warm and dry. She is not diaphoretic.   Patient was given an albuterol nebulizer treatment in the office and reported improvement in her  congestion. Lungs were clear following this treatment.  Assessment/Plan: Please see individual problem list.  Sinusitis, acute maxillary Symptoms most consistent with sinusitis. Also likely bronchitis. Given symptoms last week with improvement and then worsening we will treat with Augmentin to cover for sinus infection and bronchitis. We will also prescribe an albuterol inhaler and cough suppressant Hycodan. Discussed that Hycodan can make her drowsy. She'll continue to monitor. She's given return precautions.    Orders Placed This Encounter  Procedures  . POCT Influenza A/B   negative rapid flu.  Meds ordered this encounter  Medications  . DISCONTD: albuterol (PROVENTIL HFA;VENTOLIN HFA) 108 (90 Base) MCG/ACT inhaler    Sig: Inhale 2 puffs into the lungs every 6 (six) hours as needed for wheezing or shortness of breath.    Dispense:  1 Inhaler    Refill:  0  . amoxicillin-clavulanate (AUGMENTIN) 875-125 MG tablet    Sig: Take 1 tablet by mouth 2 (two) times daily.    Dispense:  14 tablet    Refill:  0  . albuterol (PROVENTIL) (2.5 MG/3ML) 0.083% nebulizer solution 2.5 mg    Sig:   . HYDROcodone-homatropine (HYCODAN) 5-1.5 MG/5ML syrup    Sig: Take 5 mLs by mouth every 8 (eight) hours as needed for cough.    Dispense:  120 mL    Refill:  0  . albuterol (PROVENTIL HFA;VENTOLIN HFA) 108 (90 Base) MCG/ACT inhaler    Sig: Inhale 2 puffs into the lungs every 6 (six) hours as needed for wheezing or shortness of  breath.    Dispense:  1 Inhaler    Refill:  0     Marikay Alar

## 2015-11-03 NOTE — Assessment & Plan Note (Signed)
Symptoms most consistent with sinusitis. Also likely bronchitis. Given symptoms last week with improvement and then worsening we will treat with Augmentin to cover for sinus infection and bronchitis. We will also prescribe an albuterol inhaler and cough suppressant Hycodan. Discussed that Hycodan can make her drowsy. She'll continue to monitor. She's given return precautions.

## 2015-11-03 NOTE — Progress Notes (Signed)
Pre visit review using our clinic review tool, if applicable. No additional management support is needed unless otherwise documented below in the visit note. 

## 2015-11-07 ENCOUNTER — Ambulatory Visit: Payer: BLUE CROSS/BLUE SHIELD | Admitting: Family Medicine

## 2015-11-14 ENCOUNTER — Other Ambulatory Visit: Payer: Self-pay | Admitting: Family Medicine

## 2015-11-21 ENCOUNTER — Encounter: Payer: Self-pay | Admitting: Family Medicine

## 2015-11-21 ENCOUNTER — Ambulatory Visit (INDEPENDENT_AMBULATORY_CARE_PROVIDER_SITE_OTHER): Payer: BLUE CROSS/BLUE SHIELD | Admitting: Family Medicine

## 2015-11-21 VITALS — BP 124/64 | HR 67 | Temp 98.1°F | Ht 67.25 in | Wt 243.5 lb

## 2015-11-21 DIAGNOSIS — L659 Nonscarring hair loss, unspecified: Secondary | ICD-10-CM

## 2015-11-21 DIAGNOSIS — E785 Hyperlipidemia, unspecified: Secondary | ICD-10-CM | POA: Diagnosis not present

## 2015-11-21 DIAGNOSIS — I10 Essential (primary) hypertension: Secondary | ICD-10-CM | POA: Diagnosis not present

## 2015-11-21 DIAGNOSIS — E039 Hypothyroidism, unspecified: Secondary | ICD-10-CM

## 2015-11-21 DIAGNOSIS — F39 Unspecified mood [affective] disorder: Secondary | ICD-10-CM

## 2015-11-21 DIAGNOSIS — R4586 Emotional lability: Secondary | ICD-10-CM

## 2015-11-21 NOTE — Progress Notes (Signed)
Subjective:  Patient ID: Annette Valencia, female    DOB: 09/12/1965  Age: 50 y.o. MRN: [id number]  CC: Follow up regarding chronic issues; Has concerns about mood and hair loss today.  HPI:  50 year old female presents to the clinic for follow-up. She also has additional concerns as outlined below.  Hypothyroidism  Stable currently on Synthroid 75 mcg.  HLD  Uncontrolled.  Recommend pharmacotherapy previously and patient declined.  Will discuss today.  HTN  Well controlled (at goal) on Labetalol, Losartan.  Hair loss  Patient reports her loss of the past month.  She states it is not focal.  She reports diffuse hair loss. She notices she's had significant shifting of her hair and has noticed a large amount when she comes/brushes as well as when she showers.  She states that this is been noticed by her husband.  She has had recent stress/anxiety at home as well as at work.  She has lost some weight recently after dieting but has recently started to gain it back.  Mood issues  Report sent work has been quite stressful.  Additionally, there have been stressors at home. She states that she is frustrated by the fact that her husband is not working.  Also, since moving to the area she has had difficulty making friends and really has no social life.  Over the past few months she's had decrease in her energy level and feels poorly. She states that she finds it difficult to bed. She states that she's having issues with her husband regarding the above.  She states that she is more irritable and has been avoiding confrontation. She states that she currently has a  "do not give a crap" attitude.  Social Hx   Social History   Social History  . Marital Status: Married    Spouse Name: N/A  . Number of Children: N/A  . Years of Education: N/A   Social History Main Topics  . Smoking status: Former Smoker    Quit date: 04/18/1992  . Smokeless tobacco: Never Used  .  Alcohol Use: 6.0 oz/week    10 Standard drinks or equivalent per week     Comment: wine  . Drug Use: No  . Sexual Activity:    Partners: Male   Other Topics Concern  . None   Social History Narrative   Review of Systems  Constitutional: Positive for fatigue.  Skin:       Hair loss.   Psychiatric/Behavioral:       Mood disturbance.    Objective:  BP 124/64 mmHg  Pulse 67  Temp(Src) 98.1 F (36.7 C) (Oral)  Ht 5' 7.25" (1.708 m)  Wt 243 lb 8 oz (110.451 kg)  BMI 37.86 kg/m2  SpO2 98%  BP/Weight 11/21/2015 11/03/2015 06/20/2015  Systolic BP 124 116 115  Diastolic BP 64 78 78  Wt. (Lbs) 243.5 242 -  BMI 37.86 37.63 -   Physical Exam  Constitutional: She appears well-developed. No distress.  Cardiovascular: Normal rate and regular rhythm.   Pulmonary/Chest: Effort normal and breath sounds normal.  Skin: Skin is warm and dry.  No focal hair loss noted.  Psychiatric:  Depressed mood.  Became tearful during history today.  Vitals reviewed.  Lab Results  Component Value Date   WBC 7.2 04/19/2015   HGB 12.2 04/19/2015   HCT 36.0 04/19/2015   PLT 238.0 04/19/2015   GLUCOSE 83 04/19/2015   CHOL 285* 04/19/2015   TRIG 338.0* 04/19/2015  HDL 55.60 04/19/2015   LDLDIRECT 185.0 04/19/2015   ALT 21 04/19/2015   AST 20 04/19/2015   NA 140 04/19/2015   K 3.8 04/19/2015   CL 103 04/19/2015   CREATININE 0.94 04/19/2015   BUN 18 04/19/2015   CO2 28 04/19/2015   TSH 2.20 04/19/2015   HGBA1C 5.4 04/19/2015    Assessment & Plan:   Problem List Items Addressed This Visit    Essential hypertension    At goal. Continue labetalol and losartan.      Hyperlipidemia    Uncontrolled. Discussed need for pharmacotherapy given lab values and she declined. She would like repeat labs today. Lipid panel today.      Relevant Orders   Lipid Profile   Hypothyroidism - Primary    Stable. Given hair loss, obtaining repeat TSH to ensure no change. Continuing current dose of  synthroid.      Hair loss    New problem. Exam unremarkable. Likely from underlying mood issues/stress (and psychosocial stressors). Repeating TSH today.       Relevant Orders   TSH   Mood disturbance (HCC)    New problem. Suspect underlying depression and anxiety. Worsened by psychosocial stressors and lack of support (other than her husband) in [place]. Discussed seeing a counselor and/or consideration medication. Patient like to wait at this time.        Follow-up: 6 months or sooner as needed  Everlene OtherJayce Jazzlene Huot DO [facility] [place]

## 2015-11-21 NOTE — Progress Notes (Signed)
Pre visit review using our clinic review tool, if applicable. No additional management support is needed unless otherwise documented below in the visit note. 

## 2015-11-21 NOTE — Patient Instructions (Signed)
It was nice to see you today.  Let me know if your mood issues/hair loss worsens. This is stress/depression related.  We will call with your lab results.  Continue your current medications.  Follow up:  6 months or sooner if needed.   Take care  Dr. Adriana Simasook

## 2015-11-22 DIAGNOSIS — F32A Depression, unspecified: Secondary | ICD-10-CM | POA: Insufficient documentation

## 2015-11-22 DIAGNOSIS — F329 Major depressive disorder, single episode, unspecified: Secondary | ICD-10-CM | POA: Insufficient documentation

## 2015-11-22 DIAGNOSIS — L659 Nonscarring hair loss, unspecified: Secondary | ICD-10-CM | POA: Insufficient documentation

## 2015-11-22 LAB — LIPID PANEL
CHOL/HDL RATIO: 5
Cholesterol: 262 mg/dL — ABNORMAL HIGH (ref 0–200)
HDL: 51.8 mg/dL (ref >39.00)
Triglycerides: 423 mg/dL — ABNORMAL HIGH (ref 0.0–149.0)

## 2015-11-22 LAB — LDL CHOLESTEROL, DIRECT: LDL DIRECT: 173 mg/dL

## 2015-11-22 LAB — TSH: TSH: 2.07 u[IU]/mL (ref 0.35–4.50)

## 2015-11-22 NOTE — Assessment & Plan Note (Signed)
At goal. Continue labetalol and losartan.

## 2015-11-22 NOTE — Assessment & Plan Note (Signed)
Stable. Given hair loss, obtaining repeat TSH to ensure no change. Continuing current dose of synthroid.

## 2015-11-22 NOTE — Assessment & Plan Note (Signed)
Uncontrolled. Discussed need for pharmacotherapy given lab values and she declined. She would like repeat labs today. Lipid panel today.

## 2015-11-22 NOTE — Assessment & Plan Note (Signed)
New problem. Suspect underlying depression and anxiety. Worsened by psychosocial stressors and lack of support (other than her husband) in [place]. Discussed seeing a counselor and/or consideration medication. Patient like to wait at this time.

## 2015-11-22 NOTE — Assessment & Plan Note (Signed)
New problem. Exam unremarkable. Likely from underlying mood issues/stress (and psychosocial stressors). Repeating TSH today.

## 2015-12-27 DIAGNOSIS — G4733 Obstructive sleep apnea (adult) (pediatric): Secondary | ICD-10-CM | POA: Diagnosis not present

## 2015-12-27 DIAGNOSIS — I10 Essential (primary) hypertension: Secondary | ICD-10-CM | POA: Diagnosis not present

## 2016-01-14 ENCOUNTER — Other Ambulatory Visit: Payer: Self-pay | Admitting: Family Medicine

## 2016-03-27 ENCOUNTER — Other Ambulatory Visit: Payer: Self-pay | Admitting: Family Medicine

## 2016-03-27 NOTE — Telephone Encounter (Signed)
Historical medication. Please advise? 

## 2016-03-30 ENCOUNTER — Other Ambulatory Visit: Payer: Self-pay | Admitting: Family Medicine

## 2016-04-20 ENCOUNTER — Other Ambulatory Visit: Payer: Self-pay | Admitting: Family Medicine

## 2016-05-02 ENCOUNTER — Other Ambulatory Visit: Payer: Self-pay | Admitting: Family Medicine

## 2016-05-02 DIAGNOSIS — G4733 Obstructive sleep apnea (adult) (pediatric): Secondary | ICD-10-CM | POA: Diagnosis not present

## 2016-05-02 DIAGNOSIS — I10 Essential (primary) hypertension: Secondary | ICD-10-CM | POA: Diagnosis not present

## 2016-05-27 ENCOUNTER — Other Ambulatory Visit: Payer: Self-pay | Admitting: Family Medicine

## 2016-05-28 ENCOUNTER — Encounter: Payer: Self-pay | Admitting: *Deleted

## 2016-06-19 ENCOUNTER — Encounter: Payer: Self-pay | Admitting: Family Medicine

## 2016-06-19 ENCOUNTER — Ambulatory Visit (INDEPENDENT_AMBULATORY_CARE_PROVIDER_SITE_OTHER): Payer: BLUE CROSS/BLUE SHIELD | Admitting: Family Medicine

## 2016-06-19 VITALS — BP 116/80 | HR 68 | Temp 98.1°F | Wt 240.5 lb

## 2016-06-19 DIAGNOSIS — E039 Hypothyroidism, unspecified: Secondary | ICD-10-CM | POA: Diagnosis not present

## 2016-06-19 DIAGNOSIS — I10 Essential (primary) hypertension: Secondary | ICD-10-CM | POA: Diagnosis not present

## 2016-06-19 DIAGNOSIS — M170 Bilateral primary osteoarthritis of knee: Secondary | ICD-10-CM

## 2016-06-19 DIAGNOSIS — E782 Mixed hyperlipidemia: Secondary | ICD-10-CM | POA: Diagnosis not present

## 2016-06-19 MED ORDER — LEVOTHYROXINE SODIUM 75 MCG PO TABS: 75.0000 ug | ORAL_TABLET | Freq: Every day | ORAL | 3 refills | Status: DC

## 2016-06-19 MED ORDER — LABETALOL HCL 200 MG PO TABS: 400.0000 mg | ORAL_TABLET | Freq: Every day | ORAL | 3 refills | Status: DC

## 2016-06-19 MED ORDER — CELECOXIB 200 MG PO CAPS: 200.0000 mg | ORAL_CAPSULE | Freq: Every day | ORAL | 3 refills | Status: DC

## 2016-06-19 MED ORDER — FEXOFENADINE HCL 180 MG PO TABS: 180.0000 mg | ORAL_TABLET | Freq: Every day | ORAL | 1 refills | Status: DC

## 2016-06-19 MED ORDER — LOSARTAN POTASSIUM 100 MG PO TABS: 100.0000 mg | ORAL_TABLET | Freq: Every day | ORAL | 3 refills | Status: DC

## 2016-06-19 NOTE — Progress Notes (Signed)
Subjective:  Patient ID: Annette GoodellKaren Valencia, female    DOB: Jan 24, 1966  Age: 50 y.o. MRN: [id number]  CC: Follow up, medication refill.  HPI:  50 year old female with knee osteoarthritis, hypertension, hyperlipidemia, hypothyroidism presents for follow-up.  HTN  Well controlled.  Currently on Labetalol, Losartan.  HLD  Uncontrolled.  Patient has declined medication.  She has lost ~ 15 lbs per her report.  Hypothyroidism  Stable on Synthroid 75 mcg daily.  OA  Stable on Celebrex.  Social Hx   Social History   Social History  . Marital status: Married    Spouse name: N/A  . Number of children: N/A  . Years of education: N/A   Social History Main Topics  . Smoking status: Former Smoker    Quit date: 04/18/1992  . Smokeless tobacco: Never Used  . Alcohol use 6.0 oz/week    10 Standard drinks or equivalent per week     Comment: wine  . Drug use: No  . Sexual activity: Yes    Partners: Male   Valencia Topics Concern  . None   Social History Narrative  . None    Review of Systems  Constitutional: Negative.   Musculoskeletal:       Knee pain.   Objective:  BP 116/80 (BP Location: Right Arm, Patient Position: Sitting, Cuff Size: Large)   Pulse 68   Temp 98.1 F (36.7 C) (Oral)   Wt 240 lb 8 oz (109.1 kg)   SpO2 94%   BMI 37.39 kg/m   BP/Weight 06/19/2016 11/21/2015 11/03/2015  Systolic BP 116 124 116  Diastolic BP 80 64 78  Wt. (Lbs) 240.5 243.5 242  BMI 37.39 37.86 37.63    Physical Exam  Constitutional: She is oriented to person, place, and time. She appears well-developed. No distress.  Cardiovascular: Normal rate and regular rhythm.   Pulmonary/Chest: Effort normal. She has no wheezes. She has no rales.  Abdominal: She exhibits no distension. There is no tenderness.  Neurological: She is alert and oriented to person, place, and time.  Psychiatric: She has a normal mood and affect.  Vitals reviewed.  Lab Results  Component Value Date   WBC  7.2 04/19/2015   HGB 12.2 04/19/2015   HCT 36.0 04/19/2015   PLT 238.0 04/19/2015   GLUCOSE 83 04/19/2015   CHOL 262 (H) 11/21/2015   TRIG (H) 11/21/2015    423.0 Triglyceride is over 400; calculations on Lipids are invalid.   HDL 51.80 11/21/2015   LDLDIRECT 173.0 11/21/2015   ALT 21 04/19/2015   AST 20 04/19/2015   NA 140 04/19/2015   K 3.8 04/19/2015   CL 103 04/19/2015   CREATININE 0.94 04/19/2015   BUN 18 04/19/2015   CO2 28 04/19/2015   TSH 2.07 11/21/2015   HGBA1C 5.4 04/19/2015    Assessment & Plan:   Problem List Items Addressed This Visit    Essential hypertension - Primary    Well controlled. Continue Labetalol, Losartan.      Relevant Medications   labetalol (NORMODYNE) 200 MG tablet   losartan (COZAAR) 100 MG tablet   Hyperlipidemia    Uncontrolled. Patient has declined pharmacotherapy. Continue dietary changes and weight loss.      Relevant Medications   labetalol (NORMODYNE) 200 MG tablet   losartan (COZAAR) 100 MG tablet   Hypothyroidism    Stable. Continue synthroid.      Relevant Medications   labetalol (NORMODYNE) 200 MG tablet   levothyroxine (SYNTHROID, LEVOTHROID) 75 MCG tablet  Knee osteoarthritis    Stable on Celebrex. Advised caution with chronic NSAID use.      Relevant Medications   celecoxib (CELEBREX) 200 MG capsule    Valencia Visit Diagnoses   None.     Meds ordered this encounter  Medications  . celecoxib (CELEBREX) 200 MG capsule    Sig: Take 1 capsule (200 mg total) by mouth daily.    Dispense:  90 capsule    Refill:  3  . labetalol (NORMODYNE) 200 MG tablet    Sig: Take 2 tablets (400 mg total) by mouth daily.    Dispense:  180 tablet    Refill:  3  . levothyroxine (SYNTHROID, LEVOTHROID) 75 MCG tablet    Sig: Take 1 tablet (75 mcg total) by mouth daily.    Dispense:  90 tablet    Refill:  3  . losartan (COZAAR) 100 MG tablet    Sig: Take 1 tablet (100 mg total) by mouth daily.    Dispense:  90 tablet     Refill:  3  . fexofenadine (ALLEGRA) 180 MG tablet    Sig: Take 1 tablet (180 mg total) by mouth daily.    Dispense:  90 tablet    Refill:  1    Follow-up: 6 months.  Annette Other DO [facility]

## 2016-06-19 NOTE — Progress Notes (Signed)
Pre visit review using our clinic review tool, if applicable. No additional management support is needed unless otherwise documented below in the visit note. 

## 2016-06-19 NOTE — Patient Instructions (Signed)
You're doing well.  Follow up in 6 months.  Take care  Dr. Zavier Canela  

## 2016-06-19 NOTE — Assessment & Plan Note (Signed)
Well controlled. Continue Labetalol, Losartan.

## 2016-06-19 NOTE — Assessment & Plan Note (Signed)
Stable on Celebrex. Advised caution with chronic NSAID use.

## 2016-06-19 NOTE — Assessment & Plan Note (Signed)
Uncontrolled. Patient has declined pharmacotherapy. Continue dietary changes and weight loss.

## 2016-06-19 NOTE — Assessment & Plan Note (Signed)
Stable. Continue synthroid. °

## 2016-06-25 ENCOUNTER — Other Ambulatory Visit: Payer: Self-pay | Admitting: Family Medicine

## 2017-05-05 ENCOUNTER — Other Ambulatory Visit: Payer: Self-pay | Admitting: Family Medicine

## 2017-05-07 ENCOUNTER — Ambulatory Visit (INDEPENDENT_AMBULATORY_CARE_PROVIDER_SITE_OTHER): Payer: BLUE CROSS/BLUE SHIELD | Admitting: Podiatry

## 2017-05-07 DIAGNOSIS — L609 Nail disorder, unspecified: Secondary | ICD-10-CM | POA: Diagnosis not present

## 2017-05-07 DIAGNOSIS — B351 Tinea unguium: Secondary | ICD-10-CM | POA: Diagnosis not present

## 2017-05-07 DIAGNOSIS — L603 Nail dystrophy: Secondary | ICD-10-CM | POA: Diagnosis not present

## 2017-05-07 DIAGNOSIS — L601 Onycholysis: Secondary | ICD-10-CM

## 2017-05-15 NOTE — Progress Notes (Signed)
   HPI: [id number] year old female presents today for a new complaint regarding bilateral great toenails. She states that her toenails are pulling up from the nail bed. This is been going on for the past year with a gradual onset. Patient denies trauma. She denies pain. She has had treated ingrown toenails in the past to the bilateral great toes. She is currently applying over-the-counter antifungal liquid  Past Medical History:  Diagnosis Date  . Chicken pox   . Frequent headaches   . Hyperlipidemia   . Hypertension   . Migraines   . UTI (urinary tract infection)      Physical Exam: General: The patient is alert and oriented x3 in no acute distress.  Dermatology: Thickened, discolored, dystrophic nails noted to the bilateral great toes with partial onycholysis and partial detachment. Skin is warm, dry and supple bilateral lower extremities. Negative for open lesions or macerations.  Vascular: Palpable pedal pulses bilaterally. No edema or erythema noted. Capillary refill within normal limits.  Neurological: Epicritic and protective threshold grossly intact bilaterally.   Musculoskeletal Exam: Range of motion within normal limits to all pedal and ankle joints bilateral. Muscle strength 5/5 in all groups bilateral.      Assessment: -  Onycholysis bilateral great toes   Plan of Care:  - Patient was evaluated today - Nail biopsy was performed today using a nail nipper without incident or bleeding. No biopsy was performed to the bilateral great toes. The portion of the nail which was detached was sent to pathology for fungal culture - Return to clinic in 4 weeks to discuss results. The patient is negative for fungus there is no need to return to clinic. -     Felecia ShellingBrent M. Yessica Putnam, DPM [facility]  Dr. Felecia ShellingBrent M. Giles Currie, DPM    [address]                                        [place], [place] [address]                Office [phone]  Fax [phone]

## 2017-05-28 ENCOUNTER — Ambulatory Visit: Payer: BLUE CROSS/BLUE SHIELD | Admitting: Family Medicine

## 2017-05-30 ENCOUNTER — Telehealth: Payer: Self-pay | Admitting: *Deleted

## 2017-05-31 NOTE — Telephone Encounter (Signed)
Pt requested fungal culture results.

## 2017-06-03 ENCOUNTER — Telehealth: Payer: Self-pay

## 2017-06-03 ENCOUNTER — Other Ambulatory Visit: Payer: Self-pay | Admitting: Family Medicine

## 2017-06-03 ENCOUNTER — Telehealth: Payer: Self-pay | Admitting: Podiatry

## 2017-06-03 NOTE — Telephone Encounter (Signed)
Spoke with patient regarding negative culture results, informing her that she did not need to return to clinic unless she had other questions or concerns

## 2017-06-03 NOTE — Telephone Encounter (Signed)
Not sure if you read my last note. If biopsy is negative, no need to RTC. If + for fungus, RTC to discuss tx options.  Dr. Logan Bores

## 2017-06-03 NOTE — Telephone Encounter (Signed)
Pt called wanting to know if test results have come back yet. Can be reached at [phone].

## 2017-06-04 ENCOUNTER — Ambulatory Visit: Payer: BLUE CROSS/BLUE SHIELD | Admitting: Podiatry

## 2017-06-11 ENCOUNTER — Ambulatory Visit: Payer: BLUE CROSS/BLUE SHIELD | Admitting: Podiatry

## 2017-07-01 ENCOUNTER — Encounter: Payer: Self-pay | Admitting: Family Medicine

## 2017-07-01 ENCOUNTER — Ambulatory Visit (INDEPENDENT_AMBULATORY_CARE_PROVIDER_SITE_OTHER): Payer: BLUE CROSS/BLUE SHIELD | Admitting: Family Medicine

## 2017-07-01 VITALS — BP 106/80 | HR 71 | Temp 98.2°F | Wt 252.8 lb

## 2017-07-01 DIAGNOSIS — Z1239 Encounter for other screening for malignant neoplasm of breast: Secondary | ICD-10-CM

## 2017-07-01 DIAGNOSIS — K644 Residual hemorrhoidal skin tags: Secondary | ICD-10-CM | POA: Insufficient documentation

## 2017-07-01 DIAGNOSIS — M170 Bilateral primary osteoarthritis of knee: Secondary | ICD-10-CM | POA: Diagnosis not present

## 2017-07-01 DIAGNOSIS — K625 Hemorrhage of anus and rectum: Secondary | ICD-10-CM

## 2017-07-01 DIAGNOSIS — F330 Major depressive disorder, recurrent, mild: Secondary | ICD-10-CM

## 2017-07-01 DIAGNOSIS — E039 Hypothyroidism, unspecified: Secondary | ICD-10-CM | POA: Diagnosis not present

## 2017-07-01 DIAGNOSIS — K649 Unspecified hemorrhoids: Secondary | ICD-10-CM | POA: Insufficient documentation

## 2017-07-01 DIAGNOSIS — Z1231 Encounter for screening mammogram for malignant neoplasm of breast: Secondary | ICD-10-CM

## 2017-07-01 DIAGNOSIS — I10 Essential (primary) hypertension: Secondary | ICD-10-CM | POA: Diagnosis not present

## 2017-07-01 DIAGNOSIS — R635 Abnormal weight gain: Secondary | ICD-10-CM | POA: Diagnosis not present

## 2017-07-01 NOTE — Patient Instructions (Signed)
Nice to see you. We'll check lab work and contact you with the results. We will refer you to GI for evaluation of your bleeding and colonoscopy. If you develop increased bleeding or any new or changing symptoms or thoughts of harming yourself or others please seek medical attention.

## 2017-07-01 NOTE — Assessment & Plan Note (Addendum)
Patient with depression. Does not want any treatment currently. She'll let us know if she changes her mind.

## 2017-07-01 NOTE — Assessment & Plan Note (Signed)
At goal. Patient notes occasionally missing the p.m. dose of her labetalol. Discussed option of changing this medication though she deferred. Continue to monitor.

## 2017-07-01 NOTE — Assessment & Plan Note (Signed)
Likely hemorrhoids though patient is due for colonoscopy. Patient deferred rectal exam. We'll check a CBC. Refer to GI.

## 2017-07-01 NOTE — Assessment & Plan Note (Signed)
Check TSH 

## 2017-07-01 NOTE — Progress Notes (Signed)
Tommi Rumps, MD Phone: [phone]  Annette Valencia is a 51 y.o. female who presents today for follow-up.  Patient notes she's gained about 20 pounds last 3 months. She was on a diet and lost some weight and was maintaining though she has somewhat gotten away from this. Not much exercise related arthritis. Does note she takes Celebrex which helped significantly with her arthritis.  Patient wonders if she has psoriasis given dry scalp. She notes her scalp flakes. She has noted no plaques. Occasionally does get a rash on her legs on the anterior thighs though that is small and has not changed for some time. It comes intermittently. Is not there now.  Hypertension: Doesn't check. Currently on labetalol and losartan. No chest pain or shortness of breath.  She reports a history of hemorrhoids with bright red blood per rectum intermittently for 10 years. He notes when it happens the toilet water is red. Does note it covers the stool. Stools are brown. No abdominal pain with this. Notes last evaluation for this was 10 years ago.  Patient does note some depression. Notes lots of stress and does feel sorry for herself. No anxiety. She's on medications in the past that did not like how that made her feel. She notes no SI or HI.  PMH: Former smoker   ROS see history of present illness  Objective  Physical Exam Vitals:   07/01/17 1607  BP: 106/80  Pulse: 71  Temp: 98.2 F (36.8 C)  SpO2: 97%    BP Readings from Last 3 Encounters:  07/01/17 106/80  06/19/16 116/80  11/21/15 124/64   Wt Readings from Last 3 Encounters:  07/01/17 252 lb 12.8 oz (114.7 kg)  06/19/16 240 lb 8 oz (109.1 kg)  11/21/15 243 lb 8 oz (110.5 kg)    Physical Exam  Constitutional: No distress.  HENT:  Head: Normocephalic and atraumatic.  Cardiovascular: Normal rate, regular rhythm and normal heart sounds.   Pulmonary/Chest: Effort normal and breath sounds normal.  Genitourinary:  Genitourinary  Comments: Patient declined rectal exam  Musculoskeletal: She exhibits no edema.  Neurological: She is alert. Gait normal.  Skin: Skin is warm and dry. She is not diaphoretic.  Psychiatric:  Mood depressed, intermittently tearful     Assessment/Plan: Please see individual problem list.  Essential hypertension At goal. Patient notes occasionally missing the p.m. dose of her labetalol. Discussed option of changing this medication though she deferred. Continue to monitor.  Hypothyroidism Check TSH.  Knee osteoarthritis History of osteoarthritis. Patient somewhat concerned about psoriatic arthritis though would not want any medication for this. Discussed referral to rheumatology for evaluation given family history though she deferred this at this time. She'll work on weight loss. Stable on Celebrex.  BRBPR (bright red blood per rectum) Likely hemorrhoids though patient is due for colonoscopy. Patient deferred rectal exam. We'll check a CBC. Refer to GI.  Depression Patient with depression. Does not want any treatment currently. She'll let us know if she changes her mind.   Weight gain Suspect related to discontinuing her diet and not exercising. We'll check a TSH. Check other lab work as well.   Orders Placed This Encounter  Procedures  . MM SCREENING BREAST TOMO BILATERAL    Standing Status:   Future    Standing Expiration Date:   08/31/2018    Order Specific Question:   Reason for Exam (SYMPTOM  OR DIAGNOSIS REQUIRED)    Answer:   breast cancer screening    Order Specific Question:  Is the patient pregnant?    Answer:   No    Order Specific Question:   Preferred imaging location?    Answer:   [facility]  . Comp Met (CMET)  . HgB A1c  . CBC  . TSH  . Ambulatory referral to Gastroenterology    Referral Priority:   Routine    Referral Type:   Consultation    Referral Reason:   Specialty Services Required    Number of Visits Requested:   1    Eric Sonnenberg,  MD [facility] [place]  

## 2017-07-01 NOTE — Assessment & Plan Note (Signed)
History of osteoarthritis. Patient somewhat concerned about psoriatic arthritis though would not want any medication for this. Discussed referral to rheumatology for evaluation given family history though she deferred this at this time. She'll work on weight loss. Stable on Celebrex.

## 2017-07-01 NOTE — Assessment & Plan Note (Signed)
Suspect related to discontinuing her diet and not exercising. We'll check a TSH. Check other lab work as well.

## 2017-07-02 LAB — CBC
HCT: 41.7 % (ref 36.0–46.0)
HEMOGLOBIN: 14 g/dL (ref 12.0–15.0)
MCHC: 33.6 g/dL (ref 30.0–36.0)
MCV: 91.1 fl (ref 78.0–100.0)
PLATELETS: 213 10*3/uL (ref 150.0–400.0)
RBC: 4.58 Mil/uL (ref 3.87–5.11)
RDW: 13.2 % (ref 11.5–15.5)
WBC: 6.9 10*3/uL (ref 4.0–10.5)

## 2017-07-02 LAB — COMPREHENSIVE METABOLIC PANEL
ALK PHOS: 91 U/L (ref 39–117)
ALT: 49 U/L — AB (ref 0–35)
AST: 33 U/L (ref 0–37)
Albumin: 4.5 g/dL (ref 3.5–5.2)
BILIRUBIN TOTAL: 0.6 mg/dL (ref 0.2–1.2)
BUN: 22 mg/dL (ref 6–23)
CALCIUM: 10.2 mg/dL (ref 8.4–10.5)
CO2: 32 mEq/L (ref 19–32)
Chloride: 102 mEq/L (ref 96–112)
Creatinine, Ser: 1.01 mg/dL (ref 0.40–1.20)
GFR: 61.07 mL/min (ref >60.00)
Glucose, Bld: 87 mg/dL (ref 70–99)
Potassium: 4.1 mEq/L (ref 3.5–5.1)
Sodium: 140 mEq/L (ref 135–145)
Total Protein: 7.3 g/dL (ref 6.0–8.3)

## 2017-07-02 LAB — TSH: TSH: 2.54 u[IU]/mL (ref 0.35–4.50)

## 2017-07-02 LAB — HEMOGLOBIN A1C: HEMOGLOBIN A1C: 5.7 % (ref 4.6–6.5)

## 2017-07-03 ENCOUNTER — Other Ambulatory Visit: Payer: Self-pay | Admitting: Family Medicine

## 2017-07-03 DIAGNOSIS — R945 Abnormal results of liver function studies: Principal | ICD-10-CM

## 2017-07-03 DIAGNOSIS — R7989 Other specified abnormal findings of blood chemistry: Secondary | ICD-10-CM

## 2017-07-06 ENCOUNTER — Other Ambulatory Visit: Payer: Self-pay | Admitting: Family Medicine

## 2017-07-08 MED ORDER — LOSARTAN POTASSIUM 100 MG PO TABS: 100.0000 mg | ORAL_TABLET | Freq: Every day | ORAL | 0 refills | Status: DC

## 2017-07-08 MED ORDER — LEVOTHYROXINE SODIUM 75 MCG PO TABS: 75.0000 ug | ORAL_TABLET | Freq: Every day | ORAL | 0 refills | Status: DC

## 2017-07-12 ENCOUNTER — Encounter: Payer: Self-pay | Admitting: Family Medicine

## 2017-07-22 ENCOUNTER — Ambulatory Visit: Payer: BLUE CROSS/BLUE SHIELD | Admitting: Gastroenterology

## 2017-07-22 ENCOUNTER — Encounter: Payer: Self-pay | Admitting: Gastroenterology

## 2017-07-22 VITALS — BP 118/67 | HR 71 | Temp 98.4°F | Ht 67.5 in | Wt 252.8 lb

## 2017-07-22 DIAGNOSIS — Z1211 Encounter for screening for malignant neoplasm of colon: Secondary | ICD-10-CM | POA: Diagnosis not present

## 2017-07-22 DIAGNOSIS — Z1212 Encounter for screening for malignant neoplasm of rectum: Secondary | ICD-10-CM | POA: Diagnosis not present

## 2017-07-22 MED ORDER — HYDROCORTISONE ACETATE 25 MG RE SUPP: 25.0000 mg | Freq: Every day | RECTAL | 1 refills | Status: AC

## 2017-07-22 MED ORDER — POLYETHYLENE GLYCOL 3350 17 G PO PACK: 17.0000 g | PACK | Freq: Every day | ORAL | Status: AC

## 2017-07-22 NOTE — Patient Instructions (Signed)
Start taking Miralax daily with goal of 1-2 soft BM a day. Increase or decrease miralax as discussed with you to achieve that goal.   Follow colonoscopy prep instructions that were given to you

## 2017-07-22 NOTE — Progress Notes (Signed)
Melodie BouillonVarnita Carrington Olazabal, MD [address], [address], [place], [place], [address] [address], [address], [place], [place], [address] Phone: [phone]  Fax: [phone]  Consultation  Referring Provider:     Glori LuisSonnenberg, Eric G, MD Primary Care Physician:  Glori LuisSonnenberg, Eric G, MD Primary Gastroenterologist:  Pasty SpillersVarnita B Hazaiah Edgecombe, MD        Reason for Consultation:    BRBPR  Date of Consultation:  07/22/2017         HPI:   Annette Valencia is a 51 y.o. female who was referred for bright red blood per rectum and for evaluation for a screening colonoscopy.  Patient has never had a previous colonoscopy, no family history of colon cancer.  Reports history of external hemorrhoids for over 10 years and reports intermittent bright red blood per rectum for years as well.  Reports being constipated with hard stools at times but does not use anything for a bowel movement.  Reports bright red blood in the toilet bowl when she strains a few times a week.  Hemoglobin is normal at 14.  No weight loss.  No abdominal pain.  No diarrhea.  No dysphagia or acid reflux.  Past Medical History:  Diagnosis Date  . Chicken pox   . Frequent headaches   . Hyperlipidemia   . Hypertension   . Migraines   . UTI (urinary tract infection)     Past Surgical History:  Procedure Laterality Date  . CESAREAN SECTION  1988  . tubes tied  2000    Prior to Admission medications   Medication Sig Start Date End Date Taking? Authorizing Provider  celecoxib (CELEBREX) 200 MG capsule TAKE 1 CAPSULE(200 MG) BY MOUTH DAILY 07/08/17  Yes Glori LuisSonnenberg, Eric G, MD  fexofenadine (ALLEGRA) 180 MG tablet Take 1 tablet (180 mg total) by mouth daily. 06/19/16  Yes Cook, Jayce G, DO  labetalol (NORMODYNE) 200 MG tablet Take 2 tablets (400 mg total) by mouth daily. 06/19/16  Yes Cook, Jayce G, DO  labetalol (NORMODYNE) 200 MG tablet TAKE 2 TABLETS BY MOUTH DAILY 07/08/17  Yes Glori LuisSonnenberg, Eric G, MD  levothyroxine (SYNTHROID, LEVOTHROID)  75 MCG tablet Take 1 tablet (75 mcg total) by mouth daily. 07/08/17  Yes Glori LuisSonnenberg, Eric G, MD  losartan (COZAAR) 100 MG tablet Take 1 tablet (100 mg total) by mouth daily. 07/08/17  Yes Glori LuisSonnenberg, Eric G, MD  hydrocortisone (ANUSOL-HC) 25 MG suppository Place 1 suppository (25 mg total) at bedtime for 14 doses rectally. 07/22/17 08/05/17  Pasty Spillersahiliani, Carlene Bickley B, MD    Family History  Problem Relation Age of Onset  . Arthritis Mother   . Cancer Mother        lung  . Hyperlipidemia Mother   . Alcohol abuse Father   . Stroke Father   . Hypertension Father   . ALS Father   . Hypertension Maternal Grandmother      Social History   Tobacco Use  . Smoking status: Former Smoker    Last attempt to quit: 04/18/1992    Years since quitting: 25.2  . Smokeless tobacco: Never Used  Substance Use Topics  . Alcohol use: Yes    Alcohol/week: 6.0 oz    Types: 10 Standard drinks or equivalent per week    Comment: wine  . Drug use: No    Allergies as of 07/22/2017  . (No Known Allergies)    Review of Systems:    All systems reviewed and negative except where noted in HPI.   Physical Exam:  Vital signs in last 24 hours: @VSRANGES @   General:   Pleasant, cooperative in NAD Head:  Normocephalic and atraumatic. Eyes:   No icterus.   Conjunctiva pink. PERRLA. Ears:  Normal auditory acuity. Neck:  Supple; no masses or thyroidomegaly Lungs: Respirations even and unlabored. Lungs clear to auscultation bilaterally.   No wheezes, crackles, or rhonchi.  Heart:  Regular rate and rhythm;  Without murmur, clicks, rubs or gallops Abdomen:  Soft, nondistended, nontender. Normal bowel sounds. No appreciable masses or hepatomegaly.  No rebound or guarding.  Neurologic:  Alert and oriented x3;  grossly normal neurologically. Skin:  Intact without significant lesions or rashes. Cervical Nodes:  No significant cervical adenopathy. Psych:  Alert and cooperative. Normal affect.  LAB RESULTS: No results  for input(s): WBC, HGB, HCT, PLT in the last 72 hours. BMET No results for input(s): NA, K, CL, CO2, GLUCOSE, BUN, CREATININE, CALCIUM in the last 72 hours. LFT No results for input(s): PROT, ALBUMIN, AST, ALT, ALKPHOS, BILITOT, BILIDIR, IBILI in the last 72 hours. PT/INR No results for input(s): LABPROT, INR in the last 72 hours. October 23 labs reviewed  STUDIES: No results found.    Impression / Plan:   Annette Valencia is a 51 y.o. y/o female with chronic constipation and bright red blood per rectum with straining a few times a week here for evaluation for colonoscopy for screening and due to red blood per rectum  We will schedule for colonoscopy for average risk screening Bright red blood per rectum is consistent with hemorrhoids due to constipation Patient states bright red blood does not bother her as she has had a chronically We will start her on MiraLAX daily and she was instructed to increase or decrease the dose as necessary to maintain 1-2 soft bowel movements a day without straining.  We will also start her on hydrocortisone suppository at bedtime for 14 days for hemorrhoids.   She is also agreeable for colonoscopy. I have discussed alternative options, risks & benefits,  which include, but are not limited to, bleeding, infection, perforation,respiratory complication & drug reaction.  The patient agrees with this plan & written consent will be obtained.   If MiraLAX daily and hydrocortisone suppository does not improve her bright red blood per rectum can consider surgery referral for hemorrhoidectomy or office banding procedure by Dr. Allegra LaiVanga in the near future.  Mildly elevated ALT was noted on last labs. PCP is appropriately repeating this. Her AST was normal on the last CMP. She reports 2 drinks of wine every other day. She was asked to limit alcohol  Intake to 2-3 drinks a week and no more than 2 in one sitting. She was asked to avoid hepatotoxic drugs. She was encouraged to  loose weight. ALT elevation may have been transient and elevated. If Persistent elevation is noted on repeat testing, PCP can re-refer to us for further workup if needed.    Thank you for involving me in the care of this patient.     Pasty SpillersVarnita B Shanara Schnieders, MD  07/22/2017, 9:32 AM

## 2017-07-23 ENCOUNTER — Other Ambulatory Visit (INDEPENDENT_AMBULATORY_CARE_PROVIDER_SITE_OTHER): Payer: BLUE CROSS/BLUE SHIELD

## 2017-07-23 DIAGNOSIS — R7989 Other specified abnormal findings of blood chemistry: Secondary | ICD-10-CM

## 2017-07-23 DIAGNOSIS — R945 Abnormal results of liver function studies: Secondary | ICD-10-CM | POA: Diagnosis not present

## 2017-07-24 ENCOUNTER — Encounter: Payer: Self-pay | Admitting: *Deleted

## 2017-07-24 LAB — HEPATIC FUNCTION PANEL
ALT: 46 U/L — AB (ref 0–35)
AST: 31 U/L (ref 0–37)
Albumin: 4.3 g/dL (ref 3.5–5.2)
Alkaline Phosphatase: 92 U/L (ref 39–117)
BILIRUBIN DIRECT: 0.1 mg/dL (ref 0.0–0.3)
BILIRUBIN TOTAL: 0.7 mg/dL (ref 0.2–1.2)
Total Protein: 7.2 g/dL (ref 6.0–8.3)

## 2017-07-30 ENCOUNTER — Encounter: Payer: Self-pay | Admitting: *Deleted

## 2017-08-04 ENCOUNTER — Other Ambulatory Visit: Payer: Self-pay | Admitting: Family Medicine

## 2017-08-06 ENCOUNTER — Encounter
Admission: RE | Disposition: A | Discharge status: Home / Self Care | Payer: Self-pay | Source: Ambulatory Visit | Attending: Gastroenterology

## 2017-08-06 ENCOUNTER — Ambulatory Visit
Admission: RE | Admit: 2017-08-06 | Discharge: 2017-08-06 | Disposition: A | Discharge status: Home / Self Care | Payer: BLUE CROSS/BLUE SHIELD | Source: Ambulatory Visit | Attending: Gastroenterology | Admitting: Gastroenterology

## 2017-08-06 ENCOUNTER — Ambulatory Visit: Payer: BLUE CROSS/BLUE SHIELD | Admitting: Anesthesiology

## 2017-08-06 DIAGNOSIS — G4730 Sleep apnea, unspecified: Secondary | ICD-10-CM | POA: Insufficient documentation

## 2017-08-06 DIAGNOSIS — Z9989 Dependence on other enabling machines and devices: Secondary | ICD-10-CM | POA: Insufficient documentation

## 2017-08-06 DIAGNOSIS — E785 Hyperlipidemia, unspecified: Secondary | ICD-10-CM | POA: Diagnosis not present

## 2017-08-06 DIAGNOSIS — K648 Other hemorrhoids: Secondary | ICD-10-CM | POA: Insufficient documentation

## 2017-08-06 DIAGNOSIS — K5730 Diverticulosis of large intestine without perforation or abscess without bleeding: Secondary | ICD-10-CM | POA: Diagnosis not present

## 2017-08-06 DIAGNOSIS — Z1211 Encounter for screening for malignant neoplasm of colon: Secondary | ICD-10-CM | POA: Diagnosis not present

## 2017-08-06 DIAGNOSIS — Z7951 Long term (current) use of inhaled steroids: Secondary | ICD-10-CM | POA: Insufficient documentation

## 2017-08-06 DIAGNOSIS — Z79899 Other long term (current) drug therapy: Secondary | ICD-10-CM | POA: Diagnosis not present

## 2017-08-06 DIAGNOSIS — Z791 Long term (current) use of non-steroidal anti-inflammatories (NSAID): Secondary | ICD-10-CM | POA: Insufficient documentation

## 2017-08-06 DIAGNOSIS — Z6838 Body mass index (BMI) 38.0-38.9, adult: Secondary | ICD-10-CM | POA: Insufficient documentation

## 2017-08-06 DIAGNOSIS — Z87891 Personal history of nicotine dependence: Secondary | ICD-10-CM | POA: Insufficient documentation

## 2017-08-06 DIAGNOSIS — I10 Essential (primary) hypertension: Secondary | ICD-10-CM | POA: Diagnosis not present

## 2017-08-06 DIAGNOSIS — K644 Residual hemorrhoidal skin tags: Secondary | ICD-10-CM | POA: Insufficient documentation

## 2017-08-06 DIAGNOSIS — E039 Hypothyroidism, unspecified: Secondary | ICD-10-CM | POA: Insufficient documentation

## 2017-08-06 HISTORY — DX: Unspecified osteoarthritis, unspecified site: M19.90

## 2017-08-06 HISTORY — DX: Hypothyroidism, unspecified: E03.9

## 2017-08-06 HISTORY — PX: COLONOSCOPY WITH PROPOFOL: SHX5780

## 2017-08-06 HISTORY — DX: Other specified postprocedural states: R11.2

## 2017-08-06 HISTORY — DX: Sleep apnea, unspecified: G47.30

## 2017-08-06 HISTORY — DX: Scoliosis, unspecified: M41.9

## 2017-08-06 HISTORY — DX: Other specified postprocedural states: Z98.890

## 2017-08-06 SURGERY — COLONOSCOPY WITH PROPOFOL
Anesthesia: General | Wound class: Contaminated

## 2017-08-06 MED ORDER — ONDANSETRON HCL 4 MG/2ML IJ SOLN: 4.0000 mg | Freq: Once | INTRAMUSCULAR | Status: DC | PRN

## 2017-08-06 MED ORDER — LACTATED RINGERS IV SOLN
INTRAVENOUS | Status: DC | PRN
  Administered 2017-08-06: 08:00:00 via INTRAVENOUS

## 2017-08-06 MED ORDER — LACTATED RINGERS IV SOLN: 10.0000 mL/h | INTRAVENOUS | Status: DC

## 2017-08-06 MED ORDER — ACETAMINOPHEN 160 MG/5ML PO SOLN: 325.0000 mg | ORAL | Status: DC | PRN

## 2017-08-06 MED ORDER — LIDOCAINE HCL (CARDIAC) 20 MG/ML IV SOLN
INTRAVENOUS | Status: DC | PRN
  Administered 2017-08-06: 40 mg via INTRAVENOUS

## 2017-08-06 MED ORDER — STERILE WATER FOR IRRIGATION IR SOLN
Status: DC | PRN
  Administered 2017-08-06: 08:00:00

## 2017-08-06 MED ORDER — PROPOFOL 10 MG/ML IV BOLUS
INTRAVENOUS | Status: DC | PRN
  Administered 2017-08-06: 50 mg via INTRAVENOUS
  Administered 2017-08-06: 100 mg via INTRAVENOUS
  Administered 2017-08-06: 20 mg via INTRAVENOUS
  Administered 2017-08-06 (×4): 50 mg via INTRAVENOUS
  Administered 2017-08-06: 30 mg via INTRAVENOUS

## 2017-08-06 MED ORDER — ACETAMINOPHEN 325 MG PO TABS: 325.0000 mg | ORAL_TABLET | ORAL | Status: DC | PRN

## 2017-08-06 SURGICAL SUPPLY — 23 items

## 2017-08-06 NOTE — Transfer of Care (Signed)
Immediate Anesthesia Transfer of Care Note  Patient: Annette GoodellKaren Valencia  Procedure(s) Performed: COLONOSCOPY WITH PROPOFOL (N/A )  Patient Location: PACU  Anesthesia Type: General  Level of Consciousness: awake, alert  and patient cooperative  Airway and Oxygen Therapy: Patient Spontanous Breathing and Patient connected to supplemental oxygen  Post-op Assessment: Post-op Vital signs reviewed, Patient's Cardiovascular Status Stable, Respiratory Function Stable, Patent Airway and No signs of Nausea or vomiting  Post-op Vital Signs: Reviewed and stable  Complications: No apparent anesthesia complications

## 2017-08-06 NOTE — Anesthesia Preprocedure Evaluation (Signed)
Anesthesia Evaluation  Patient identified by MRN, date of birth, ID band Patient awake    Reviewed: Allergy & Precautions, NPO status   History of Anesthesia Complications (+) PONV  Airway Mallampati: II  TM Distance: >3 FB     Dental no notable dental hx.    Pulmonary sleep apnea , former smoker,    breath sounds clear to auscultation       Cardiovascular hypertension,  Rhythm:Regular Rate:Normal     Neuro/Psych    GI/Hepatic   Endo/Other  Hypothyroidism Morbid obesity (BMI 39)  Renal/GU      Musculoskeletal  (+) Arthritis ,   Abdominal (+) + obese,   Peds  Hematology   Anesthesia Other Findings   Reproductive/Obstetrics                            Anesthesia Physical Anesthesia Plan  ASA: III  Anesthesia Plan: General   Post-op Pain Management:    Induction: Intravenous  PONV Risk Score and Plan:   Airway Management Planned: Nasal Cannula  Additional Equipment:   Intra-op Plan:   Post-operative Plan:   Informed Consent: I have reviewed the patients History and Physical, chart, labs and discussed the procedure including the risks, benefits and alternatives for the proposed anesthesia with the patient or authorized representative who has indicated his/her understanding and acceptance.     Plan Discussed with: CRNA  Anesthesia Plan Comments:         Anesthesia Quick Evaluation

## 2017-08-06 NOTE — Op Note (Addendum)
[facility] Gastroenterology Patient Name: Annette GoodellKaren Valencia Procedure Date: 08/06/2017 7:16 AM MRN: [id number] Account #: [id number] Date of Birth: 1966-03-20 Admit Type: Outpatient Age: [age] [facility] Gender: Female Note Status: Finalized Procedure:            Colonoscopy Indications:          Screening for colorectal malignant neoplasm Providers:             B. Maximino Greenlandahiliani MD, MD Referring MD:         Yehuda MaoEric G. Birdie SonsSonnenberg (Referring MD) Medicines:            Monitored Anesthesia Care Complications:        No immediate complications. Procedure:            Pre-Anesthesia Assessment:                       - ASA Grade Assessment: III - A patient with severe                        systemic disease.                       - Prior to the procedure, a History and Physical was                        performed, and patient medications, allergies and                        sensitivities were reviewed. The patient's tolerance of                        previous anesthesia was reviewed.                       After obtaining informed consent, the colonoscope was                        passed under direct vision. Throughout the procedure,                        the patient's blood pressure, pulse, and oxygen                        saturations were monitored continuously. The Olympus                        190 Colonoscope [id number]) was introduced through the                        anus and advanced to the the cecum, identified by                        appendiceal orifice and ileocecal valve. The                        colonoscopy was performed with ease. The patient                        tolerated the procedure well. The quality of the bowel  preparation was good. The prep in the cecum was fair,                        some liquid stool was present in the cecum and was                        cleaned with water and suctioning. Findings:   Multiple small and large-mouthed diverticula were found in the sigmoid       colon, descending colon and ascending colon.      Non-bleeding internal hemorrhoids were found during retroflexion. The       hemorrhoids were small.      Squamous mucosa seen on reetroflexion.      A skin tag was seen externally in the perianal area. It was not       bleeding, did not appear enlarged or swollen.      The perianal and digital rectal examinations were normal otherwise.      The entire examined colon appeared normal otherwise.      The retroflexed view of the distal rectum and anal verge was normal and       showed no anal or rectal abnormalities. Impression:           - Diverticulosis in the sigmoid colon, in the                        descending colon and in the ascending colon.                       - Non-bleeding internal hemorrhoids.                       - The entire examined colon is normal otherwise.                       - No specimens collected. Recommendation:       - Discharge patient to home (with escort).                       - Advance diet as tolerated. High fiber diet                       - Continue present medications including anusol                        suppositories already prescribed and Miralax daily                       - Pt. can be referred to Dermatology or Surgery by PCP                        for external skin tag if needed                       - Repeat colonoscopy in 5 years for screening purposes                        due to fair prep in the cecum.                       - The findings and recommendations were discussed with  the patient.                       - The findings and recommendations were discussed with                        the patient's family.                       - Return to primary care physician as previously                        scheduled. Procedure Code(s):    --- Professional ---                       Z6109,  Colorectal cancer screening; colonoscopy on                        individual not meeting criteria for high risk Diagnosis Code(s):    --- Professional ---                       Z12.11, Encounter for screening for malignant neoplasm                        of colon                       K64.8, Other hemorrhoids                       K57.30, Diverticulosis of large intestine without                        perforation or abscess without bleeding CPT copyright 2016 American Medical Association. All rights reserved. The codes documented in this report are preliminary and upon coder review may  be revised to meet current compliance requirements.  Melodie Bouillon, MD Michel Bickers B. Maximino Greenland MD, MD 08/06/2017 8:22:29 AM This report has been signed electronically. Number of Addenda: 0 Note Initiated On: 08/06/2017 7:16 AM Scope Withdrawal Time: 0 hours 14 minutes 41 seconds  Total Procedure Duration: 0 hours 25 minutes 11 seconds  Estimated Blood Loss: Estimated blood loss: none.      [facility]

## 2017-08-06 NOTE — Anesthesia Postprocedure Evaluation (Signed)
Anesthesia Post Note  Patient: Annette GoodellKaren Valencia  Procedure(s) Performed: COLONOSCOPY WITH PROPOFOL (N/A )  Patient location during evaluation: PACU Anesthesia Type: General Level of consciousness: awake Pain management: pain level controlled Vital Signs Assessment: post-procedure vital signs reviewed and stable Respiratory status: respiratory function stable Cardiovascular status: stable Postop Assessment: no signs of nausea or vomiting Anesthetic complications: no    Jola BabinskiElsje Uriel Dowding

## 2017-08-06 NOTE — Anesthesia Procedure Notes (Signed)
Procedure Name: MAC Date/Time: 08/06/2017 7:38 AM Performed by: Janna Arch, CRNA Pre-anesthesia Checklist: Patient identified, Emergency Drugs available, Suction available and Patient being monitored Patient Re-evaluated:Patient Re-evaluated prior to induction Oxygen Delivery Method: Nasal cannula

## 2017-08-06 NOTE — Discharge Instructions (Signed)
General Anesthesia, Adult, Care After °These instructions provide you with information about caring for yourself after your procedure. Your health care provider may also give you more specific instructions. Your treatment has been planned according to current medical practices, but problems sometimes occur. Call your health care provider if you have any problems or questions after your procedure. °What can I expect after the procedure? °After the procedure, it is common to have: °· Vomiting. °· A sore throat. °· Mental slowness. ° °It is common to feel: °· Nauseous. °· Cold or shivery. °· Sleepy. °· Tired. °· Sore or achy, even in parts of your body where you did not have surgery. ° °Follow these instructions at home: °For at least 24 hours after the procedure: °· Do not: °? Participate in activities where you could fall or become injured. °? Drive. °? Use heavy machinery. °? Drink alcohol. °? Take sleeping pills or medicines that cause drowsiness. °? Make important decisions or sign legal documents. °? Take care of children on your own. °· Rest. °Eating and drinking °· If you vomit, drink water, juice, or soup when you can drink without vomiting. °· Drink enough fluid to keep your urine clear or pale yellow. °· Make sure you have little or no nausea before eating solid foods. °· Follow the diet recommended by your health care provider. °General instructions °· Have a responsible adult stay with you until you are awake and alert. °· Return to your normal activities as told by your health care provider. Ask your health care provider what activities are safe for you. °· Take over-the-counter and prescription medicines only as told by your health care provider. °· If you smoke, do not smoke without supervision. °· Keep all follow-up visits as told by your health care provider. This is important. °Contact a health care provider if: °· You continue to have nausea or vomiting at home, and medicines are not helpful. °· You  cannot drink fluids or start eating again. °· You cannot urinate after 8-12 hours. °· You develop a skin rash. °· You have fever. °· You have increasing redness at the site of your procedure. °Get help right away if: °· You have difficulty breathing. °· You have chest pain. °· You have unexpected bleeding. °· You feel that you are having a life-threatening or urgent problem. °This information is not intended to replace advice given to you by your health care provider. Make sure you discuss any questions you have with your health care provider. °Document Released: 12/03/2000 Document Revised: 01/30/2016 Document Reviewed: 08/11/2015 °Elsevier Interactive Patient Education © 2018 Elsevier Inc. ° °

## 2017-08-06 NOTE — H&P (Signed)
Melodie BouillonVarnita Tahiliani, MD [address], [address], [place], [place], [id number] [address], [address], [place], [place], [address] Phone: [phone]  Fax: [phone]  Primary Care Physician:  Glori LuisSonnenberg, Eric G, MD   Pre-Procedure History & Physical: HPI:  Annette Valencia is a 51 y.o. female is here for an colonoscopy.   Past Medical History:  Diagnosis Date  . Arthritis    knees  . Chicken pox   . Frequent headaches   . Hyperlipidemia   . Hypertension   . Hypothyroidism   . Migraines   . PONV (postoperative nausea and vomiting)   . Scoliosis   . Sleep apnea    CPAP  . UTI (urinary tract infection)     Past Surgical History:  Procedure Laterality Date  . CERVICAL ABLATION  2017  . CESAREAN SECTION  1988  . tubes tied  2000    Prior to Admission medications   Medication Sig Start Date End Date Taking? Authorizing Provider  celecoxib (CELEBREX) 200 MG capsule TAKE 1 CAPSULE(200 MG) BY MOUTH DAILY 07/08/17  Yes Glori LuisSonnenberg, Eric G, MD  fexofenadine (ALLEGRA) 180 MG tablet Take 1 tablet (180 mg total) by mouth daily. 06/19/16  Yes Cook, Jayce G, DO  fluticasone (FLONASE) 50 MCG/ACT nasal spray Place into both nostrils daily.   Yes [provider]  labetalol (NORMODYNE) 200 MG tablet Take 2 tablets (400 mg total) by mouth daily. 06/19/16  Yes Cook, Jayce G, DO  levothyroxine (SYNTHROID, LEVOTHROID) 75 MCG tablet Take 1 tablet (75 mcg total) by mouth daily. 07/08/17  Yes Glori LuisSonnenberg, Eric G, MD  losartan (COZAAR) 100 MG tablet Take 1 tablet (100 mg total) by mouth daily. 07/08/17  Yes Glori LuisSonnenberg, Eric G, MD  labetalol (NORMODYNE) 200 MG tablet TAKE 2 TABLETS BY MOUTH DAILY 08/05/17   Glori LuisSonnenberg, Eric G, MD    Allergies as of 07/22/2017  . (No Known Allergies)    Family History  Problem Relation Age of Onset  . Arthritis Mother   . Cancer Mother        lung  . Hyperlipidemia Mother   . Alcohol abuse Father   . Stroke Father   . Hypertension Father   .  ALS Father   . Hypertension Maternal Grandmother     Social History   Socioeconomic History  . Marital status: Married    Spouse name: Not on file  . Number of children: Not on file  . Years of education: Not on file  . Highest education level: Not on file  Social Needs  . Financial resource strain: Not on file  . Food insecurity - worry: Not on file  . Food insecurity - inability: Not on file  . Transportation needs - medical: Not on file  . Transportation needs - non-medical: Not on file  Occupational History  . Not on file  Tobacco Use  . Smoking status: Former Smoker    Last attempt to quit: 04/18/1992    Years since quitting: 25.3  . Smokeless tobacco: Never Used  Substance and Sexual Activity  . Alcohol use: Yes    Alcohol/week: 6.0 oz    Types: 10 Glasses of wine per week    Comment: wine  . Drug use: No  . Sexual activity: Yes    Partners: Male  Other Topics Concern  . Not on file  Social History Narrative  . Not on file    Review of Systems: See HPI, otherwise negative ROS  Physical Exam: BP 109/76  Pulse 77   Temp (!) 97.2 F (36.2 C)   Ht 5' 7.5" (1.715 m)   Wt 111.6 kg (246 lb)   SpO2 97%   BMI 37.96 kg/m  General:   Alert,  pleasant and cooperative in NAD Head:  Normocephalic and atraumatic. Neck:  Supple; no masses or thyromegaly. Lungs:  Clear throughout to auscultation, normal respiratory effort.    Heart:  +S1, +S2, Regular rate and rhythm, No edema. Abdomen:  Soft, nontender and nondistended. Normal bowel sounds, without guarding, and without rebound.   Neurologic:  Alert and  oriented x4;  grossly normal neurologically.  Impression/Plan: Annette Valencia is here for an colonoscopy to be performed for surveillance due to screening for colorectal cancer   Risks, benefits, limitations, and alternatives regarding  colonoscopy have been reviewed with the patient.  Questions have been answered.  All parties agreeable.   Pasty SpillersVarnita B  Tahiliani, MD  08/06/2017, 7:29 AM

## 2017-08-07 ENCOUNTER — Encounter: Payer: Self-pay | Admitting: Gastroenterology

## 2017-08-14 ENCOUNTER — Encounter: Payer: Self-pay | Admitting: *Deleted

## 2017-08-20 ENCOUNTER — Ambulatory Visit
Admission: RE | Admit: 2017-08-20 | Discharge: 2017-08-20 | Disposition: A | Discharge status: Home / Self Care | Payer: BLUE CROSS/BLUE SHIELD | Source: Ambulatory Visit | Attending: Family Medicine | Admitting: Family Medicine

## 2017-08-20 DIAGNOSIS — Z1239 Encounter for other screening for malignant neoplasm of breast: Secondary | ICD-10-CM

## 2017-08-20 DIAGNOSIS — Z1231 Encounter for screening mammogram for malignant neoplasm of breast: Secondary | ICD-10-CM | POA: Diagnosis not present

## 2017-08-25 ENCOUNTER — Telehealth: Payer: Self-pay | Admitting: Family Medicine

## 2017-08-25 NOTE — Telephone Encounter (Signed)
It looks like the patient had a mammogram completed last week though it has not been read yet. Can you check with the imaging center to see when this will be read? Thanks.

## 2017-08-26 NOTE — Telephone Encounter (Signed)
Noted. Thanks for checking.

## 2017-08-26 NOTE — Telephone Encounter (Signed)
Annette Valencia is [place] [place], [facility]

## 2017-08-29 ENCOUNTER — Inpatient Hospital Stay
Admission: RE | Admit: 2017-08-29 | Discharge: 2017-08-29 | Disposition: A | Discharge status: Home / Self Care | Payer: Self-pay | Source: Ambulatory Visit | Attending: *Deleted | Admitting: *Deleted

## 2017-08-29 ENCOUNTER — Other Ambulatory Visit: Payer: Self-pay | Admitting: *Deleted

## 2017-08-29 DIAGNOSIS — Z9289 Personal history of other medical treatment: Secondary | ICD-10-CM

## 2017-09-03 ENCOUNTER — Other Ambulatory Visit: Payer: Self-pay | Admitting: Family Medicine

## 2017-09-04 ENCOUNTER — Other Ambulatory Visit: Payer: Self-pay | Admitting: Family Medicine

## 2017-09-22 ENCOUNTER — Other Ambulatory Visit: Payer: Self-pay | Admitting: Family Medicine

## 2017-10-04 ENCOUNTER — Ambulatory Visit: Payer: BLUE CROSS/BLUE SHIELD | Admitting: Family Medicine

## 2017-10-05 ENCOUNTER — Other Ambulatory Visit: Payer: Self-pay | Admitting: Family Medicine

## 2017-10-08 NOTE — Telephone Encounter (Signed)
Okay to refill? No refills were added last month

## 2017-10-08 NOTE — Telephone Encounter (Signed)
I believe the patient is taking 200 mg by mouth twice daily of this medication based on prior documentation though her prescription does not reflect this.  Please confirm this.  I sent it in as 200 mg by mouth twice daily.  Thanks.

## 2017-10-09 NOTE — Telephone Encounter (Signed)
Patient state she takes 1 tablet by mouth twice a day

## 2017-10-28 ENCOUNTER — Ambulatory Visit: Payer: BLUE CROSS/BLUE SHIELD | Admitting: Internal Medicine

## 2017-10-28 ENCOUNTER — Encounter: Payer: Self-pay | Admitting: Internal Medicine

## 2017-10-28 VITALS — BP 120/78 | HR 68 | Temp 98.9°F | Ht 67.5 in | Wt 256.8 lb

## 2017-10-28 DIAGNOSIS — N3001 Acute cystitis with hematuria: Secondary | ICD-10-CM | POA: Diagnosis not present

## 2017-10-28 LAB — URINALYSIS, ROUTINE W REFLEX MICROSCOPIC
Bilirubin Urine: NEGATIVE
Ketones, ur: NEGATIVE
Nitrite: NEGATIVE
Specific Gravity, Urine: 1.01 (ref 1.000–1.030)
Total Protein, Urine: NEGATIVE
UROBILINOGEN UA: 0.2 (ref 0.0–1.0)
Urine Glucose: NEGATIVE
pH: 6.5 (ref 5.0–8.0)

## 2017-10-28 MED ORDER — CIPROFLOXACIN HCL 500 MG PO TABS: 500.0000 mg | ORAL_TABLET | Freq: Two times a day (BID) | ORAL | 0 refills | Status: DC

## 2017-10-28 MED ORDER — FLUCONAZOLE 150 MG PO TABS: 150.0000 mg | ORAL_TABLET | Freq: Once | ORAL | 0 refills | Status: AC

## 2017-10-28 NOTE — Patient Instructions (Signed)
Call back this week if not better or my chart us I hope you are better  Take care   Urinary Tract Infection, Adult A urinary tract infection (UTI) is an infection of any part of the urinary tract. The urinary tract includes the:  Kidneys.  Ureters.  Bladder.  Urethra.  These organs make, store, and get rid of pee (urine) in the body. Follow these instructions at home:  Take over-the-counter and prescription medicines only as told by your doctor.  If you were prescribed an antibiotic medicine, take it as told by your doctor. Do not stop taking the antibiotic even if you start to feel better.  Avoid the following drinks: ? Alcohol. ? Caffeine. ? Tea. ? Carbonated drinks.  Drink enough fluid to keep your pee clear or pale yellow.  Keep all follow-up visits as told by your doctor. This is important.  Make sure to: ? Empty your bladder often and completely. Do not to hold pee for long periods of time. ? Empty your bladder before and after sex. ? Wipe from front to back after a bowel movement if you are female. Use each tissue one time when you wipe. Contact a doctor if:  You have back pain.  You have a fever.  You feel sick to your stomach (nauseous).  You throw up (vomit).  Your symptoms do not get better after 3 days.  Your symptoms go away and then come back. Get help right away if:  You have very bad back pain.  You have very bad lower belly (abdominal) pain.  You are throwing up and cannot keep down any medicines or water. This information is not intended to replace advice given to you by your health care provider. Make sure you discuss any questions you have with your health care provider. Document Released: 02/13/2008 Document Revised: 02/02/2016 Document Reviewed: 07/18/2015 Elsevier Interactive Patient Education  [employer]2018 Elsevier Inc.

## 2017-10-28 NOTE — Progress Notes (Signed)
Chief Complaint  Patient presents with  . Urinary Tract Infection   C/o increased urinary freq. Over the weekend and lower abdominal pressure/pain and left lower back pain she is unsure if related. Denies dysuria. She is having urgency. Denies increased water intake drinks 2-3 cups coffee per day.  Also urine has been darker and c/w blood.       Review of Systems  Constitutional: Negative for weight loss.  HENT: Negative for hearing loss.   Eyes: Negative for blurred vision.  Respiratory: Negative for shortness of breath.   Cardiovascular: Negative for chest pain.  Gastrointestinal: Positive for abdominal pain.  Genitourinary: Positive for frequency, hematuria and urgency. Negative for dysuria.  Musculoskeletal: Positive for back pain.  Skin: Negative for rash.   Past Medical History:  Diagnosis Date  . Arthritis    knees  . Chicken pox   . Frequent headaches   . Hyperlipidemia   . Hypertension   . Hypothyroidism   . Migraines   . PONV (postoperative nausea and vomiting)   . Scoliosis   . Sleep apnea    CPAP  . UTI (urinary tract infection)    Past Surgical History:  Procedure Laterality Date  . CERVICAL ABLATION  2017  . CESAREAN SECTION  1988  . COLONOSCOPY WITH PROPOFOL N/A 08/06/2017   Procedure: COLONOSCOPY WITH PROPOFOL;  Surgeon: Pasty Spillersahiliani, Varnita B, MD;  [facility];  Service: Endoscopy;  Laterality: N/A;  please keep early  . tubes tied  2000   Family History  Problem Relation Age of Onset  . Arthritis Mother   . Cancer Mother        lung  . Hyperlipidemia Mother   . Alcohol abuse Father   . Stroke Father   . Hypertension Father   . ALS Father   . Hypertension Maternal Grandmother    Social History   Socioeconomic History  . Marital status: Married    Spouse name: Not on file  . Number of children: Not on file  . Years of education: Not on file  . Highest education level: Not on file  Social Needs  . Financial resource  strain: Not on file  . Food insecurity - worry: Not on file  . Food insecurity - inability: Not on file  . Transportation needs - medical: Not on file  . Transportation needs - non-medical: Not on file  Occupational History  . Not on file  Tobacco Use  . Smoking status: Former Smoker    Last attempt to quit: 04/18/1992    Years since quitting: 25.5  . Smokeless tobacco: Never Used  Substance and Sexual Activity  . Alcohol use: Yes    Alcohol/week: 6.0 oz    Types: 10 Glasses of wine per week    Comment: wine  . Drug use: No  . Sexual activity: Yes    Partners: Male  Other Topics Concern  . Not on file  Social History Narrative  . Not on file   Current Meds  Medication Sig  . celecoxib (CELEBREX) 200 MG capsule TAKE 1 CAPSULE(200 MG) BY MOUTH DAILY  . fexofenadine (ALLEGRA) 180 MG tablet Take 1 tablet (180 mg total) by mouth daily.  . fluticasone (FLONASE) 50 MCG/ACT nasal spray Place into both nostrils daily.  Marland Kitchen. labetalol (NORMODYNE) 200 MG tablet Take 1 tablet (200 mg total) by mouth 2 (two) times daily.  Marland Kitchen. levothyroxine (SYNTHROID, LEVOTHROID) 75 MCG tablet Take 1 tablet (75 mcg total) by mouth daily.  .Marland Kitchen  losartan (COZAAR) 100 MG tablet TAKE 1 TABLET(100 MG) BY MOUTH DAILY   No Known Allergies No results found for this or any previous visit (from the past 2160 hour(s)). Objective  Body mass index is 39.63 kg/m. Wt Readings from Last 3 Encounters:  10/28/17 256 lb 12.8 oz (116.5 kg)  08/06/17 246 lb (111.6 kg)  07/22/17 252 lb 12.8 oz (114.7 kg)   Temp Readings from Last 3 Encounters:  10/28/17 98.9 F (37.2 C) (Oral)  08/06/17 97.8 F (36.6 C)  07/22/17 98.4 F (36.9 C) (Oral)   BP Readings from Last 3 Encounters:  10/28/17 120/78  08/06/17 99/72  07/22/17 118/67   Pulse Readings from Last 3 Encounters:  10/28/17 68  08/06/17 79  07/22/17 71   O2 sat room air 98%  Physical Exam  Constitutional: She is oriented to person, place, and time and  well-developed, well-nourished, and in no distress. Vital signs are normal.  HENT:  Head: Normocephalic and atraumatic.  Mouth/Throat: Oropharynx is clear and moist and mucous membranes are normal.  Eyes: Conjunctivae are normal. Pupils are equal, round, and reactive to light.  Cardiovascular: Normal rate, regular rhythm and normal heart sounds.  Pulmonary/Chest: Effort normal and breath sounds normal.  Abdominal: Soft. Bowel sounds are normal. There is tenderness in the suprapubic area. There is no CVA tenderness.  Mild pressure suprapubic region   Neurological: She is alert and oriented to person, place, and time. Gait normal. Gait normal.  Skin: Skin is warm, dry and intact.  Psychiatric: Mood, memory, affect and judgment normal.  Nursing note and vitals reviewed.   Assessment   1. Increased urinary freq/urgency c/w UTI dipstick moderate blood and large leuks Plan  1. UA, culture  Increase water intake  Empirically tx cipro bid x 5 days with Diflucan   Provider: Dr. French Ana McLean-Scocuzza-Internal Medicine

## 2017-10-28 NOTE — Progress Notes (Signed)
Pre visit review using our clinic review tool, if applicable. No additional management support is needed unless otherwise documented below in the visit note. 

## 2017-10-31 LAB — URINE CULTURE
MICRO NUMBER:: 90212852
SPECIMEN QUALITY:: ADEQUATE

## 2017-11-03 ENCOUNTER — Other Ambulatory Visit: Payer: Self-pay | Admitting: Family Medicine

## 2017-11-06 ENCOUNTER — Other Ambulatory Visit: Payer: Self-pay

## 2017-11-06 MED ORDER — LEVOTHYROXINE SODIUM 75 MCG PO TABS: 75.0000 ug | ORAL_TABLET | Freq: Every day | ORAL | 0 refills | Status: DC

## 2017-11-20 ENCOUNTER — Other Ambulatory Visit: Payer: Self-pay

## 2017-11-20 ENCOUNTER — Encounter: Payer: Self-pay | Admitting: Family Medicine

## 2017-11-20 ENCOUNTER — Ambulatory Visit (INDEPENDENT_AMBULATORY_CARE_PROVIDER_SITE_OTHER): Payer: BLUE CROSS/BLUE SHIELD | Admitting: Family Medicine

## 2017-11-20 VITALS — BP 114/76 | HR 67 | Temp 98.0°F | Wt 254.8 lb

## 2017-11-20 DIAGNOSIS — K649 Unspecified hemorrhoids: Secondary | ICD-10-CM | POA: Diagnosis not present

## 2017-11-20 DIAGNOSIS — I10 Essential (primary) hypertension: Secondary | ICD-10-CM | POA: Diagnosis not present

## 2017-11-20 DIAGNOSIS — Z6839 Body mass index (BMI) 39.0-39.9, adult: Secondary | ICD-10-CM | POA: Diagnosis not present

## 2017-11-20 DIAGNOSIS — J069 Acute upper respiratory infection, unspecified: Secondary | ICD-10-CM

## 2017-11-20 DIAGNOSIS — R319 Hematuria, unspecified: Secondary | ICD-10-CM | POA: Diagnosis not present

## 2017-11-20 DIAGNOSIS — R7989 Other specified abnormal findings of blood chemistry: Secondary | ICD-10-CM | POA: Insufficient documentation

## 2017-11-20 DIAGNOSIS — R945 Abnormal results of liver function studies: Secondary | ICD-10-CM | POA: Diagnosis not present

## 2017-11-20 DIAGNOSIS — E669 Obesity, unspecified: Secondary | ICD-10-CM | POA: Insufficient documentation

## 2017-11-20 LAB — POCT URINALYSIS DIPSTICK
BILIRUBIN UA: NEGATIVE
Blood, UA: NEGATIVE
Glucose, UA: NEGATIVE
KETONES UA: NEGATIVE
Nitrite, UA: NEGATIVE
PROTEIN UA: NEGATIVE
Spec Grav, UA: 1.02 (ref 1.010–1.025)
Urobilinogen, UA: 0.2 E.U./dL
pH, UA: 5.5 (ref 5.0–8.0)

## 2017-11-20 NOTE — Assessment & Plan Note (Signed)
Suspect URI or allergies. Discussed supportive care.

## 2017-11-20 NOTE — Assessment & Plan Note (Signed)
Likely related to UTI.  Will recheck today.

## 2017-11-20 NOTE — Assessment & Plan Note (Signed)
Recheck today.  Benign exam.  Congratulated on decreased alcohol intake.

## 2017-11-20 NOTE — Patient Instructions (Addendum)
Nice to see you. I hope you start to feel better.  If you do not start to feel better please let us know. We will check lab work today and contact you with the results.

## 2017-11-20 NOTE — Assessment & Plan Note (Signed)
Well-controlled.  Continue current regimen.  Check CMP. 

## 2017-11-20 NOTE — Progress Notes (Signed)
  Tommi Rumps, MD Phone: [phone]  Annette Valencia is a 52 y.o. female who presents today for f/u.  She notes sore throat starting yesterday.  Gradual in onset.  Some upper respiratory congestion this morning though that has gone away.  Still slightly sore throat.  No significant cough.  No postnasal drip.  No fevers.  Has had sick contacts.  Does note some muscular shoulder and neck soreness and tightness.  No other muscular soreness and tightness.  She had a colonoscopy.  Found internal hemorrhoids.  No polyps.  They did notice an external skin tag that needs to be rechecked.  She saw 1 of our other providers for a UTI.  She had some suprapubic abdominal discomfort and dark colored urine.  The symptoms resolved prior to going on antibiotics.  She has not had a recurrence.  Needs follow-up UA for hematuria.  Elevated LFTs: Noted on prior lab work.  She notes no right upper quadrant pain.  Previously she was drinking 1-2 glasses of wine per day.  She did that for a long time.  She started back to 3 days a week.  No Tylenol.  She is not really been exercising or working on diet.  HYPERTENSION  Disease Monitoring  Home BP Monitoring well controlled Chest pain- no    Dyspnea- no Medications  Compliance-  Taking losartan, labetalol.    Social History   Tobacco Use  Smoking Status Former Smoker  . Last attempt to quit: 04/18/1992  . Years since quitting: 25.6  Smokeless Tobacco Never Used     ROS see history of present illness  Objective  Physical Exam Vitals:   11/20/17 1609  BP: 114/76  Pulse: 67  Temp: 98 F (36.7 C)  SpO2: 96%    BP Readings from Last 3 Encounters:  11/20/17 114/76  10/28/17 120/78  08/06/17 99/72   Wt Readings from Last 3 Encounters:  11/20/17 254 lb 12.8 oz (115.6 kg)  10/28/17 256 lb 12.8 oz (116.5 kg)  08/06/17 246 lb (111.6 kg)    Physical Exam  Constitutional: No distress.  HENT:  Head: Normocephalic and atraumatic.    Mouth/Throat: Oropharynx is clear and moist. No oropharyngeal exudate.  Normal TMs  Eyes: Conjunctivae are normal. Pupils are equal, round, and reactive to light.  Neck: Neck supple.  Cardiovascular: Normal rate, regular rhythm and normal heart sounds.  Pulmonary/Chest: Effort normal and breath sounds normal.  Abdominal: Soft. Bowel sounds are normal. She exhibits no distension. There is no tenderness. There is no rebound and no guarding.  Musculoskeletal: She exhibits no edema.  Lymphadenopathy:    She has no cervical adenopathy.  Neurological: She is alert. Gait normal.  Skin: Skin is warm and dry. She is not diaphoretic.  Rectum with benign appearing skin tag   Assessment/Plan: Please see individual problem list.  Elevated LFTs Recheck today.  Benign exam.  Congratulated on decreased alcohol intake.  Hematuria Likely related to UTI.  Will recheck today.  Upper respiratory infection Suspect URI or allergies. Discussed supportive care.   Hemorrhoids Colonoscopy up-to-date now.  Benign-appearing skin tag on exam.  Essential hypertension Well-controlled.  Continue current regimen.  Check CMP.  Obesity Discussed diet and exercise.   Orders Placed This Encounter  Procedures  . Comp Met (CMET)  . POCT Urinalysis Dipstick    No orders of the defined types were placed in this encounter.    Tommi Rumps, MD [place]

## 2017-11-20 NOTE — Assessment & Plan Note (Addendum)
Colonoscopy up-to-date now.  Benign-appearing skin tag on exam.

## 2017-11-20 NOTE — Assessment & Plan Note (Signed)
Discussed diet and exercise 

## 2017-11-21 LAB — COMPREHENSIVE METABOLIC PANEL
ALBUMIN: 4.5 g/dL (ref 3.5–5.2)
ALT: 50 U/L — ABNORMAL HIGH (ref 0–35)
AST: 39 U/L — AB (ref 0–37)
Alkaline Phosphatase: 93 U/L (ref 39–117)
BUN: 16 mg/dL (ref 6–23)
CHLORIDE: 102 meq/L (ref 96–112)
CO2: 29 mEq/L (ref 19–32)
CREATININE: 0.99 mg/dL (ref 0.40–1.20)
Calcium: 10.4 mg/dL (ref 8.4–10.5)
GFR: 62.4 mL/min (ref >60.00)
Glucose, Bld: 96 mg/dL (ref 70–99)
Potassium: 4.2 mEq/L (ref 3.5–5.1)
SODIUM: 139 meq/L (ref 135–145)
Total Bilirubin: 0.7 mg/dL (ref 0.2–1.2)
Total Protein: 7.5 g/dL (ref 6.0–8.3)

## 2017-11-23 ENCOUNTER — Other Ambulatory Visit: Payer: Self-pay | Admitting: Family Medicine

## 2017-11-23 DIAGNOSIS — R945 Abnormal results of liver function studies: Principal | ICD-10-CM

## 2017-11-23 DIAGNOSIS — R7989 Other specified abnormal findings of blood chemistry: Secondary | ICD-10-CM

## 2017-12-03 ENCOUNTER — Other Ambulatory Visit: Payer: Self-pay | Admitting: Family Medicine

## 2017-12-30 ENCOUNTER — Other Ambulatory Visit: Payer: Self-pay | Admitting: Family Medicine

## 2017-12-31 NOTE — Telephone Encounter (Signed)
Refilled: 09/23/2017 Last OV: 11/20/2017 Next OV: not scheduled

## 2018-01-21 ENCOUNTER — Ambulatory Visit: Payer: BLUE CROSS/BLUE SHIELD | Admitting: Gastroenterology

## 2018-01-29 ENCOUNTER — Ambulatory Visit: Payer: BLUE CROSS/BLUE SHIELD | Admitting: Gastroenterology

## 2018-01-31 DIAGNOSIS — M41125 Adolescent idiopathic scoliosis, thoracolumbar region: Secondary | ICD-10-CM | POA: Diagnosis not present

## 2018-01-31 DIAGNOSIS — M5489 Other dorsalgia: Secondary | ICD-10-CM | POA: Diagnosis not present

## 2018-02-05 ENCOUNTER — Other Ambulatory Visit: Payer: Self-pay | Admitting: Family Medicine

## 2018-02-11 ENCOUNTER — Ambulatory Visit: Payer: BLUE CROSS/BLUE SHIELD | Admitting: Gastroenterology

## 2018-02-11 ENCOUNTER — Other Ambulatory Visit: Payer: Self-pay

## 2018-02-11 ENCOUNTER — Encounter: Payer: Self-pay | Admitting: Gastroenterology

## 2018-02-11 VITALS — BP 102/66 | HR 70 | Ht 67.5 in | Wt 262.6 lb

## 2018-02-11 DIAGNOSIS — K760 Fatty (change of) liver, not elsewhere classified: Secondary | ICD-10-CM

## 2018-02-11 DIAGNOSIS — R748 Abnormal levels of other serum enzymes: Secondary | ICD-10-CM | POA: Diagnosis not present

## 2018-02-11 NOTE — Progress Notes (Signed)
Annette BouillonVarnita Samreet [place] [address]  [address]  [place], [place] [address]  Main: [phone]  Fax: [phone]   Gastroenterology Consultation  Referring Provider:     Glori LuisSonnenberg, Eric G, MD Primary Care Physician:  Glori LuisSonnenberg, Eric G, MD Primary Gastroenterologist:  Dr. Melodie BouillonVarnita Kao Berkheimer Reason for Consultation:     Elevated liver enzymes        HPI:    Chief Complaint  Patient presents with  . Establish Care    referral from Dr. Birdie SonsSonnenberg: elevated LFT's, BRBPR    Marina GoodellKaren Bogdon is a 52 y.o. y/o female referred for consultation & management  by Dr. Birdie SonsSonnenberg, Yehuda MaoEric G, MD.  Patient referred by her primary care provider due to elevated AST and ALT.  Patient reports 1-2 drinks of wine every night or every other night.  Also takes herbal supplements.  No other hepatotoxic drugs.  States father had liver cirrhosis from alcohol.  No episodes of confusion, bleeding, edema.  No previous EGDs.  Colonoscopy up-to-date.  Past Medical History:  Diagnosis Date  . Arthritis    knees  . Chicken pox   . Frequent headaches   . Hyperlipidemia   . Hypertension   . Hypothyroidism   . Migraines   . PONV (postoperative nausea and vomiting)   . Scoliosis   . Sleep apnea    CPAP  . UTI (urinary tract infection)     Past Surgical History:  Procedure Laterality Date  . CERVICAL ABLATION  2017  . CESAREAN SECTION  1988  . COLONOSCOPY WITH PROPOFOL N/A 08/06/2017   Procedure: COLONOSCOPY WITH PROPOFOL;  Surgeon: Pasty Spillersahiliani, Olinda Nola B, MD;  [facility];  Service: Endoscopy;  Laterality: N/A;  please keep early  . tubes tied  2000    Prior to Admission medications   Medication Sig Start Date End Date Taking? Authorizing Provider  celecoxib (CELEBREX) 200 MG capsule TAKE 1 CAPSULE(200 MG) BY MOUTH DAILY 12/31/17  Yes Glori LuisSonnenberg, Eric G, MD  fexofenadine (ALLEGRA) 180 MG tablet Take 1 tablet (180 mg total) by mouth daily. 06/19/16  Yes Cook, Jayce G, DO  fluticasone  (FLONASE) 50 MCG/ACT nasal spray Place into both nostrils daily.   Yes [provider]  labetalol (NORMODYNE) 200 MG tablet Take 1 tablet (200 mg total) by mouth 2 (two) times daily. 10/08/17  Yes Glori LuisSonnenberg, Eric G, MD  levothyroxine (SYNTHROID, LEVOTHROID) 75 MCG tablet TAKE 1 TABLET(75 MCG) BY MOUTH DAILY 02/05/18  Yes Glori LuisSonnenberg, Eric G, MD  losartan (COZAAR) 100 MG tablet TAKE 1 TABLET(100 MG) BY MOUTH DAILY 12/03/17  Yes Glori LuisSonnenberg, Eric G, MD    Family History  Problem Relation Age of Onset  . Arthritis Mother   . Cancer Mother        lung  . Hyperlipidemia Mother   . Alcohol abuse Father   . Stroke Father   . Hypertension Father   . ALS Father   . Hypertension Maternal Grandmother      Social History   Tobacco Use  . Smoking status: Former Smoker    Last attempt to quit: 04/18/1992    Years since quitting: 25.8  . Smokeless tobacco: Never Used  Substance Use Topics  . Alcohol use: Yes    Alcohol/week: 6.0 oz    Types: 10 Glasses of wine per week    Comment: wine  . Drug use: No    Allergies as of 02/11/2018  . (No Known Allergies)    Review of Systems:  All systems reviewed and negative except where noted in HPI.   Physical Exam:  BP 102/66   Pulse 70   Ht 5' 7.5" (1.715 m)   Wt 262 lb 9.6 oz (119.1 kg)   BMI 40.52 kg/m  No LMP recorded. Patient has had an ablation. Psych:  Alert and cooperative. Normal mood and affect. General:   Alert,  Well-developed, well-nourished, pleasant and cooperative in NAD Head:  Normocephalic and atraumatic. Eyes:  Sclera clear, no icterus.   Conjunctiva pink. Ears:  Normal auditory acuity. Nose:  No deformity, discharge, or lesions. Mouth:  No deformity or lesions,oropharynx pink & moist. Neck:  Supple; no masses or thyromegaly. Lungs:  Respirations even and unlabored.  Clear throughout to auscultation.   No wheezes, crackles, or rhonchi. No acute distress. Heart:  Regular rate and rhythm; no murmurs, clicks, rubs,  or gallops. Abdomen:  Normal bowel sounds.  No bruits.  Soft, non-tender and non-distended without masses, hepatosplenomegaly or hernias noted.  No guarding or rebound tenderness.    Msk:  Symmetrical without gross deformities. Good, equal movement & strength bilaterally. Pulses:  Normal pulses noted. Extremities:  No clubbing or edema.  No cyanosis. Neurologic:  Alert and oriented x3;  grossly normal neurologically. Skin:  Intact without significant lesions or rashes. No jaundice. Lymph Nodes:  No significant cervical adenopathy. Psych:  Alert and cooperative. Normal mood and affect.   Labs: CBC    Component Value Date/Time   WBC 6.9 07/01/2017 1630   RBC 4.58 07/01/2017 1630   HGB 14.0 07/01/2017 1630   HCT 41.7 07/01/2017 1630   PLT 213.0 07/01/2017 1630   MCV 91.1 07/01/2017 1630   MCHC 33.6 07/01/2017 1630   RDW 13.2 07/01/2017 1630   CMP     Component Value Date/Time   NA 139 11/20/2017 1641   K 4.2 11/20/2017 1641   CL 102 11/20/2017 1641   CO2 29 11/20/2017 1641   GLUCOSE 96 11/20/2017 1641   BUN 16 11/20/2017 1641   CREATININE 0.99 11/20/2017 1641   CALCIUM 10.4 11/20/2017 1641   PROT 7.5 11/20/2017 1641   ALBUMIN 4.5 11/20/2017 1641   AST 39 (H) 11/20/2017 1641   ALT 50 (H) 11/20/2017 1641   ALKPHOS 93 11/20/2017 1641   BILITOT 0.7 11/20/2017 1641    Imaging Studies: No results found.  Assessment and Plan:   Teeghan Hammer is a 52 y.o. y/o female has been referred for elevated AST and ALT  Mild elevation in transaminases likely due to fatty liver from alcohol intake, and elevated BMI Patient educated about the same, and handout given as well Weight loss, diet and exercise, and abstinence from alcohol discussed in detail and the importance of these were stressed repeatedly I offered to refer to nutritionist, but she does not want this referral at this time I have asked her to try to lose weight, by cutting down her meals in half to see if this helps,  as that would lower caloric intake as well  Risk of fatty liver to progress to cirrhosis, without institution above measures were discussed in detail as well and she verbalized understanding.  We will complete work-up for elevated liver enzymes, including right upper quadrant ultrasound, viral hepatitis labs, autoimmune labs  No clinical evidence of cirrhosis at this time   Dr Annette Valencia

## 2018-02-11 NOTE — Patient Instructions (Signed)
F/u 3-6 months  Fatty Liver Fatty liver, also called hepatic steatosis or steatohepatitis, is a condition in which too much fat has built up in your liver cells. The liver removes harmful substances from your bloodstream. It produces fluids your body needs. It also helps your body use and store energy from the food you eat. In many cases, fatty liver does not cause symptoms or problems. It is often diagnosed when tests are being done for other reasons. However, over time, fatty liver can cause inflammation that may lead to more serious liver problems, such as scarring of the liver (cirrhosis). What are the causes? Causes of fatty liver may include:  Drinking too much alcohol.  Poor nutrition.  Obesity.  Cushing syndrome.  Diabetes.  Hyperlipidemia.  Pregnancy.  Certain drugs.  Poisons.  Some viral infections.  What increases the risk? You may be more likely to develop fatty liver if you:  Abuse alcohol.  Are pregnant.  Are overweight.  Have diabetes.  Have hepatitis.  Have a high triglyceride level.  What are the signs or symptoms? Fatty liver often does not cause any symptoms. In cases where symptoms develop, they can include:  Fatigue.  Weakness.  Weight loss.  Confusion.  Abdominal pain.  Yellowing of your skin and the white parts of your eyes (jaundice).  Nausea and vomiting.  How is this diagnosed? Fatty liver may be diagnosed by:  Physical exam and medical history.  Blood tests.  Imaging tests, such as an ultrasound, CT scan, or MRI.  Liver biopsy. A small sample of liver tissue is removed using a needle. The sample is then looked at under a microscope.  How is this treated? Fatty liver is often caused by other health conditions. Treatment for fatty liver may involve medicines and lifestyle changes to manage conditions such as:  Alcoholism.  High cholesterol.  Diabetes.  Being overweight or obese.  Follow these instructions at  home:  Eat a healthy diet as directed by your health care provider.  Exercise regularly. This can help you lose weight and control your cholesterol and diabetes. Talk to your health care provider about an exercise plan and which activities are best for you.  Do not drink alcohol.  Take medicines only as directed by your health care provider. Contact a health care provider if: You have difficulty controlling your:  Blood sugar.  Cholesterol.  Alcohol consumption.  Get help right away if:  You have abdominal pain.  You have jaundice.  You have nausea and vomiting. This information is not intended to replace advice given to you by your health care provider. Make sure you discuss any questions you have with your health care provider. Document Released: 10/12/2005 Document Revised: 02/02/2016 Document Reviewed: 01/06/2014 Elsevier Interactive Patient Education  [employer]2018 [employer]vier Inc.

## 2018-02-11 NOTE — Addendum Note (Signed)
Addended by: Jackquline DenmarkIDGEWAY, Lakoda Mcanany W on: 02/11/2018 04:58 PM   Modules accepted: Orders

## 2018-03-10 ENCOUNTER — Other Ambulatory Visit: Payer: Self-pay | Admitting: Family Medicine

## 2018-03-27 ENCOUNTER — Other Ambulatory Visit: Payer: Self-pay | Admitting: Family Medicine

## 2018-04-07 ENCOUNTER — Encounter: Payer: Self-pay | Admitting: Family Medicine

## 2018-04-07 ENCOUNTER — Telehealth: Payer: Self-pay

## 2018-04-07 NOTE — Telephone Encounter (Signed)
I will forward to CMA to call patient to triage this.

## 2018-04-07 NOTE — Telephone Encounter (Signed)
Patient has been notified of Dr. Kermit BaloSonnenbergs recommendations for patient to be seen at ED patient verbalized understanding.

## 2018-04-07 NOTE — Telephone Encounter (Signed)
Received the following message from the CMA: "Called patient and she states that she has been shortness of breath for quite a while ( no specific time frame). No has no complaints of chest pain but does feel pressure in her chest. Denies dizziness but says her vision has been blurry. She also states that when she uses her CPAP she still feels short of breath and feels like she is being suffocated. She has had some fatigue. She states that she feels short of breath even when she is being still and with movement there is no difference in how she feels with being short of breath."   Please call the patient back. She needs to be evaluated sooner that we will be able to see her. These symptoms are concerning for her heart or lungs as a cause. If she is having chest pressure or shortness of breath now she should go to the ED for evaluation. If she is not having it at this time I would suggest urgent care evaluation today to ensure there is not a serious underlying cause for her symptoms. Thanks.

## 2018-04-08 ENCOUNTER — Other Ambulatory Visit: Payer: Self-pay | Admitting: Family Medicine

## 2018-04-08 DIAGNOSIS — R600 Localized edema: Secondary | ICD-10-CM | POA: Diagnosis not present

## 2018-04-08 DIAGNOSIS — R0602 Shortness of breath: Secondary | ICD-10-CM | POA: Diagnosis not present

## 2018-04-12 DIAGNOSIS — Z823 Family history of stroke: Secondary | ICD-10-CM | POA: Diagnosis not present

## 2018-04-12 DIAGNOSIS — Z87891 Personal history of nicotine dependence: Secondary | ICD-10-CM | POA: Diagnosis not present

## 2018-04-12 DIAGNOSIS — E785 Hyperlipidemia, unspecified: Secondary | ICD-10-CM | POA: Diagnosis not present

## 2018-04-12 DIAGNOSIS — E7800 Pure hypercholesterolemia, unspecified: Secondary | ICD-10-CM | POA: Diagnosis not present

## 2018-04-12 DIAGNOSIS — R072 Precordial pain: Secondary | ICD-10-CM | POA: Diagnosis not present

## 2018-04-12 DIAGNOSIS — E039 Hypothyroidism, unspecified: Secondary | ICD-10-CM | POA: Diagnosis not present

## 2018-04-12 DIAGNOSIS — I10 Essential (primary) hypertension: Secondary | ICD-10-CM | POA: Diagnosis not present

## 2018-04-12 DIAGNOSIS — R0602 Shortness of breath: Secondary | ICD-10-CM | POA: Diagnosis not present

## 2018-04-12 DIAGNOSIS — R079 Chest pain, unspecified: Secondary | ICD-10-CM | POA: Diagnosis not present

## 2018-04-12 DIAGNOSIS — R600 Localized edema: Secondary | ICD-10-CM | POA: Diagnosis not present

## 2018-04-12 DIAGNOSIS — Z6839 Body mass index (BMI) 39.0-39.9, adult: Secondary | ICD-10-CM | POA: Diagnosis not present

## 2018-04-12 DIAGNOSIS — R110 Nausea: Secondary | ICD-10-CM | POA: Diagnosis not present

## 2018-04-12 DIAGNOSIS — I208 Other forms of angina pectoris: Secondary | ICD-10-CM | POA: Diagnosis not present

## 2018-04-12 DIAGNOSIS — R109 Unspecified abdominal pain: Secondary | ICD-10-CM | POA: Diagnosis not present

## 2018-04-12 DIAGNOSIS — J189 Pneumonia, unspecified organism: Secondary | ICD-10-CM | POA: Diagnosis not present

## 2018-04-12 DIAGNOSIS — R609 Edema, unspecified: Secondary | ICD-10-CM | POA: Diagnosis not present

## 2018-04-12 DIAGNOSIS — R61 Generalized hyperhidrosis: Secondary | ICD-10-CM | POA: Diagnosis not present

## 2018-04-12 DIAGNOSIS — G4733 Obstructive sleep apnea (adult) (pediatric): Secondary | ICD-10-CM | POA: Diagnosis not present

## 2018-04-12 DIAGNOSIS — K760 Fatty (change of) liver, not elsewhere classified: Secondary | ICD-10-CM | POA: Diagnosis not present

## 2018-04-12 DIAGNOSIS — R197 Diarrhea, unspecified: Secondary | ICD-10-CM | POA: Diagnosis not present

## 2018-04-12 DIAGNOSIS — R0789 Other chest pain: Secondary | ICD-10-CM | POA: Diagnosis not present

## 2018-04-13 DIAGNOSIS — I10 Essential (primary) hypertension: Secondary | ICD-10-CM | POA: Diagnosis not present

## 2018-04-13 DIAGNOSIS — E039 Hypothyroidism, unspecified: Secondary | ICD-10-CM | POA: Diagnosis not present

## 2018-04-13 DIAGNOSIS — R600 Localized edema: Secondary | ICD-10-CM | POA: Diagnosis not present

## 2018-04-13 DIAGNOSIS — R0789 Other chest pain: Secondary | ICD-10-CM | POA: Diagnosis not present

## 2018-04-14 DIAGNOSIS — R600 Localized edema: Secondary | ICD-10-CM | POA: Diagnosis not present

## 2018-04-14 DIAGNOSIS — R0789 Other chest pain: Secondary | ICD-10-CM | POA: Diagnosis not present

## 2018-04-14 DIAGNOSIS — I10 Essential (primary) hypertension: Secondary | ICD-10-CM | POA: Diagnosis not present

## 2018-04-14 DIAGNOSIS — E039 Hypothyroidism, unspecified: Secondary | ICD-10-CM | POA: Diagnosis not present

## 2018-04-16 DIAGNOSIS — R600 Localized edema: Secondary | ICD-10-CM | POA: Diagnosis not present

## 2018-04-16 DIAGNOSIS — I10 Essential (primary) hypertension: Secondary | ICD-10-CM | POA: Diagnosis not present

## 2018-04-16 DIAGNOSIS — E7800 Pure hypercholesterolemia, unspecified: Secondary | ICD-10-CM | POA: Diagnosis not present

## 2018-04-16 DIAGNOSIS — R945 Abnormal results of liver function studies: Secondary | ICD-10-CM | POA: Diagnosis not present

## 2018-04-18 DIAGNOSIS — I10 Essential (primary) hypertension: Secondary | ICD-10-CM | POA: Diagnosis not present

## 2018-04-18 DIAGNOSIS — E669 Obesity, unspecified: Secondary | ICD-10-CM | POA: Diagnosis not present

## 2018-04-18 DIAGNOSIS — R079 Chest pain, unspecified: Secondary | ICD-10-CM | POA: Diagnosis not present

## 2018-04-18 DIAGNOSIS — E785 Hyperlipidemia, unspecified: Secondary | ICD-10-CM | POA: Diagnosis not present

## 2018-04-30 ENCOUNTER — Encounter: Payer: Self-pay | Admitting: Family Medicine

## 2018-04-30 ENCOUNTER — Ambulatory Visit: Payer: BLUE CROSS/BLUE SHIELD | Admitting: Family Medicine

## 2018-04-30 VITALS — BP 114/80 | HR 60 | Temp 97.9°F | Ht 68.0 in | Wt 257.8 lb

## 2018-04-30 DIAGNOSIS — E782 Mixed hyperlipidemia: Secondary | ICD-10-CM | POA: Diagnosis not present

## 2018-04-30 DIAGNOSIS — I10 Essential (primary) hypertension: Secondary | ICD-10-CM | POA: Diagnosis not present

## 2018-04-30 DIAGNOSIS — R0609 Other forms of dyspnea: Secondary | ICD-10-CM | POA: Diagnosis not present

## 2018-04-30 DIAGNOSIS — F330 Major depressive disorder, recurrent, mild: Secondary | ICD-10-CM

## 2018-04-30 DIAGNOSIS — R0600 Dyspnea, unspecified: Secondary | ICD-10-CM

## 2018-04-30 NOTE — Assessment & Plan Note (Signed)
Well-controlled.  She will continue her current regimen. 

## 2018-04-30 NOTE — Progress Notes (Signed)
Annette AlarEric Annette Colan, MD Phone: [phone]  Annette GoodellKaren Valencia is a 52 y.o. female who presents today for hospital follow-up  CC: Hospital follow-up for chest pain and shortness of breath  Patient reports she had had about 6 months of slowly increasing and then stable dyspnea on exertion.  She had been having bilateral lower extremity edema as well which has resolved.  She then subsequently developed an episode where she got diaphoretic and nauseous as well as dizzy and then developed chest pressure.  She wonders if the dizziness was related to low blood pressure from the fluid pill she was on for the edema in her legs and she subsequently developed quite a bit of anxiety regarding this and developed chest pressure.  She was hospitalized for this and had negative troponins.  They were trying to arrange for a stress test though were unable to get this done while she was in the hospital and she had this done as an outpatient and it was normal.  The cardiologist decreased her labetalol dose.  She is no longer on the fluid pill.  She is on a statin.  She does note continued mild dyspnea on exertion.  She notices it more at night when she is walking and not as much during the day.  Some mild cough though no hemoptysis.  No wheezing.  No history of asthma.  No smoking history.  No history of VTE.  On review it appears that her d-dimer was 237 and [facility].  It does not appear that this was mentioned in the patient's discharge summary.  She has not had any recurrent chest discomfort, diaphoresis, nausea, or dizziness.  Her edema has improved.  She does still have some shortness of breath.  She does report quite a bit of stress at work and some mild depression related to this.  She notes no SI.  She declines treatment.  Social History   Tobacco Use  Smoking Status Former Smoker  . Last attempt to quit: 04/18/1992  . Years since quitting: 26.0  Smokeless Tobacco Never Used     ROS see  history of present illness  Objective  Physical Exam Vitals:   04/30/18 1131  BP: 114/80  Pulse: 60  Temp: 97.9 F (36.6 C)  SpO2: 97%    BP Readings from Last 3 Encounters:  04/30/18 114/80  02/11/18 102/66  11/20/17 114/76   Wt Readings from Last 3 Encounters:  04/30/18 257 lb 12.8 oz (116.9 kg)  02/11/18 262 lb 9.6 oz (119.1 kg)  11/20/17 254 lb 12.8 oz (115.6 kg)    Physical Exam  Constitutional: No distress.  Cardiovascular: Normal rate, regular rhythm and normal heart sounds.  Pulmonary/Chest: Effort normal and breath sounds normal.  Musculoskeletal: She exhibits no edema.  Neurological: She is alert.  Skin: Skin is warm and dry. She is not diaphoretic.     Assessment/Plan: Please see individual problem list.  DOE (dyspnea on exertion) Patient with dyspnea on exertion that appears to be improved to some degree and mild at this time.  She did have chest pain requiring hospitalization.  Cardiac work-up appears to have been negative.  D-dimer was borderline when she was in the hospital and it does not appear there is any comment on this.  She does not seem to have had any pleuritic complaints or vital sign abnormalities to indicate a PE.  Given that she is several weeks out from that test I opted to repeat it and  then determine if she needs imaging versus pulmonary follow-up.  Discussed the possibility of other pulmonary causes and we will refer her to pulmonology for evaluation.  She is given return precautions.  Depression Seems to be related to her job.  She defers treatment at this time.  She will monitor.  Essential hypertension Well-controlled.  She will continue her current regimen.  Hyperlipidemia Now on statin therapy.  We will need to recheck her cholesterol at follow-up.   Orders Placed This Encounter  Procedures  . D-Dimer, Quantitative  . Ambulatory referral to Pulmonology    Referral Priority:   Routine    Referral Type:   Consultation     Referral Reason:   Specialty Services Required    Requested Specialty:   Pulmonary Disease    Number of Visits Requested:   1    No orders of the defined types were placed in this encounter.    Annette AlarEric Ardene Remley, MD [facility]

## 2018-04-30 NOTE — Assessment & Plan Note (Signed)
Patient with dyspnea on exertion that appears to be improved to some degree and mild at this time.  She did have chest pain requiring hospitalization.  Cardiac work-up appears to have been negative.  D-dimer was borderline when she was in the hospital and it does not appear there is any comment on this.  She does not seem to have had any pleuritic complaints or vital sign abnormalities to indicate a PE.  Given that she is several weeks out from that test I opted to repeat it and then determine if she needs imaging versus pulmonary follow-up.  Discussed the possibility of other pulmonary causes and we will refer her to pulmonology for evaluation.  She is given return precautions.

## 2018-04-30 NOTE — Assessment & Plan Note (Signed)
Now on statin therapy.  We will need to recheck her cholesterol at follow-up.

## 2018-04-30 NOTE — Assessment & Plan Note (Signed)
Seems to be related to her job.  She defers treatment at this time.  She will monitor.

## 2018-04-30 NOTE — Patient Instructions (Addendum)
Nice to see you. We will check lab work to evaluate for blood clot.  If this is elevated we will need to proceed with a CT scan. We will get you to see pulmonology as well. If you develop recurrent chest pain or worsening shortness of breath please seek medical attention immediately.

## 2018-05-01 ENCOUNTER — Ambulatory Visit
Admission: RE | Admit: 2018-05-01 | Discharge: 2018-05-01 | Disposition: A | Discharge status: Home / Self Care | Payer: BLUE CROSS/BLUE SHIELD | Source: Ambulatory Visit | Attending: Family Medicine | Admitting: Family Medicine

## 2018-05-01 ENCOUNTER — Telehealth: Payer: Self-pay | Admitting: Family Medicine

## 2018-05-01 DIAGNOSIS — R0600 Dyspnea, unspecified: Secondary | ICD-10-CM

## 2018-05-01 DIAGNOSIS — R945 Abnormal results of liver function studies: Secondary | ICD-10-CM

## 2018-05-01 DIAGNOSIS — R7989 Other specified abnormal findings of blood chemistry: Secondary | ICD-10-CM

## 2018-05-01 DIAGNOSIS — R590 Localized enlarged lymph nodes: Secondary | ICD-10-CM

## 2018-05-01 DIAGNOSIS — R0609 Other forms of dyspnea: Secondary | ICD-10-CM

## 2018-05-01 LAB — D-DIMER, QUANTITATIVE: D-Dimer, Quant: 0.51 mcg/mL FEU — ABNORMAL HIGH (ref <0.50)

## 2018-05-01 MED ORDER — IOPAMIDOL (ISOVUE-370) INJECTION 76%
75.0000 mL | Freq: Once | INTRAVENOUS | Status: AC | PRN
  Administered 2018-05-01: 75 mL via INTRAVENOUS

## 2018-05-01 NOTE — Telephone Encounter (Signed)
Spoke with patient regarding results.  Advised that her d-dimer was minimally elevated.  It was minimally elevated previously in the hospital with her shortness of breath and she has not had a CT angiogram.  We will obtain a CTA to evaluate further.  She notes she has been 2 years without her menstrual cycle.  CT angiogram ordered and will be scheduled for today.

## 2018-05-01 NOTE — Telephone Encounter (Signed)
Spoke with the patient regarding findings of CT scan.  There is no PE noted.  Did discuss hilar adenopathy that they felt was nonspecific and I discussed the possible differential with her.  We will have her see pulmonology given the possible sarcoidosis and we will have her see hematology/oncology given enlarged lymph nodes.  She also noted that she never followed up with GI for completion of work-up of elevated LFTs.  We will place a referral back to them.  I will forward this message to our referral coordinator to get this set up.

## 2018-05-02 ENCOUNTER — Telehealth: Payer: Self-pay

## 2018-05-02 NOTE — Telephone Encounter (Signed)
All referrals have been sent. Thanks MT

## 2018-05-02 NOTE — Telephone Encounter (Signed)
I called pt in regards to the ultrasound ordered in June and pt states she was not going to do it but Dr. Birdie SonsSonnenberg wanted her to see Dr. Maximino Greenlandahiliani again, so she thought she would have this done first.  She was upset over $1000 bill, she received from her colonoscopy of which she states that she was told by the office that the procedure was covered by her insurance and does not want to have another $60 copay. She was also told that Dr. Maximino Greenlandahiliani was in her network and at the time she was not. I informed the pt that contacting her insurance is her primary responsibility because how much is covered, etc. Is not told to us. I had suggested pt call the hospital billing and she said they told her to contact our office. I informed pt that I will have Jody, our office manager contact her regarding this. Notified pt that her ultrasound is scheduled for 05/06/18 at 8:00 am, [facility] am [facility], address and phone number given.

## 2018-05-04 DIAGNOSIS — R590 Localized enlarged lymph nodes: Secondary | ICD-10-CM

## 2018-05-04 HISTORY — DX: Localized enlarged lymph nodes: R59.0

## 2018-05-04 NOTE — Progress Notes (Signed)
Annette Valencia  [phone] [phone]) [phone]  ID: Annette Valencia OB: 1965/12/21  MR#: [id number]  KGM#:[id number]#:[id number]  Patient Care Team: Glori LuisSonnenberg, Eric G, MD as PCP - General (Family Medicine)  CHIEF COMPLAINT: Hilar lymphadenopathy.  INTERVAL HISTORY: Patient is a 52 year old female who [facility] shortness of breath and increasing chest pain.  She reports an extensive cardiac work-up that did not reveal an etiology.  She had a chest CT that revealed a mild hilar lymphadenopathy.  She currently feels well and is asymptomatic.  She has no neurologic complaints.  She denies any fevers, chills, or weight loss.  She has no further chest pain, but continues to have shortness of breath.  She denies any nausea, vomiting, constipation, or diarrhea.  She has no urinary complaints.  Patient otherwise feels well and offers no further specific complaints.  REVIEW OF SYSTEMS:   Review of Systems  Constitutional: Negative.  Negative for fever, malaise/fatigue and weight loss.  Respiratory: Positive for shortness of breath. Negative for cough and hemoptysis.   Cardiovascular: Negative.  Negative for chest pain and leg swelling.  Gastrointestinal: Negative.  Negative for abdominal pain.  Genitourinary: Negative.  Negative for dysuria.  Musculoskeletal: Negative.  Negative for back pain.  Skin: Negative.  Negative for rash.  Neurological: Negative.  Negative for dizziness, focal weakness, weakness and headaches.  Psychiatric/Behavioral: Negative.  The patient is not nervous/anxious.     As per HPI. Otherwise, a complete review of systems is negative.  PAST MEDICAL HISTORY: Past Medical History:  Diagnosis Date  . Arthritis    knees  . Chicken pox   . Frequent headaches   . Hyperlipidemia   . Hypertension   . Hypothyroidism   . Migraines   . PONV (postoperative nausea and vomiting)   . Scoliosis   . Sleep apnea    CPAP  . UTI  (urinary tract infection)     PAST SURGICAL HISTORY: Past Surgical History:  Procedure Laterality Date  . CERVICAL ABLATION  2017  . CESAREAN SECTION  1988  . COLONOSCOPY WITH PROPOFOL N/A 08/06/2017   Procedure: COLONOSCOPY WITH PROPOFOL;  Surgeon: Pasty Spillersahiliani, Varnita B, MD;  [facility];  Service: Endoscopy;  Laterality: N/A;  please keep early  . tubes tied  2000    FAMILY HISTORY: Family History  Problem Relation Age of Onset  . Arthritis Mother   . Cancer Mother        lung  . Hyperlipidemia Mother   . Alcohol abuse Father   . Stroke Father   . Hypertension Father   . ALS Father   . Hypertension Maternal Grandmother     ADVANCED DIRECTIVES (Y/N):  N  HEALTH MAINTENANCE: Social History   Tobacco Use  . Smoking status: Former Smoker    Last attempt to quit: 04/18/1992    Years since quitting: 26.0  . Smokeless tobacco: Never Used  Substance Use Topics  . Alcohol use: Yes    Alcohol/week: 10.0 standard drinks    Types: 10 Glasses of wine per week    Comment: wine  . Drug use: No     Colonoscopy:  PAP:  Bone density:  Lipid panel:  No Known Allergies  Current Outpatient Medications  Medication Sig Dispense Refill  . atorvastatin (LIPITOR) 40 MG tablet Take 40 mg by mouth daily.    . celecoxib (CELEBREX) 200 MG capsule TAKE 1 CAPSULE(200 MG) BY MOUTH DAILY 90 capsule 1  . fexofenadine (ALLEGRA)  180 MG tablet Take 1 tablet (180 mg total) by mouth daily. 90 tablet 1  . fluocinonide (LIDEX) 0.05 % external solution Apply 1 mL topically 2 (two) times daily.  2  . fluticasone (FLONASE) 50 MCG/ACT nasal spray Place into both nostrils daily.    Marland Kitchen labetalol (NORMODYNE) 200 MG tablet Take 1 tablet (200 mg total) by mouth 2 (two) times daily. (Patient taking differently: Take 100 mg by mouth 2 (two) times daily. ) 180 tablet 3  . levothyroxine (SYNTHROID, LEVOTHROID) 75 MCG tablet TAKE 1 TABLET(75 MCG) BY MOUTH DAILY 90 tablet 0  . losartan (COZAAR)  100 MG tablet TAKE 1 TABLET(100 MG) BY MOUTH DAILY (Patient taking differently: 50 mg 2 (two) times daily. ) 90 tablet 0  . nitroGLYCERIN (NITROSTAT) 0.4 MG SL tablet DISSOLVE 1 TAB UNDER TONGUE EVERY 5 MINS TO MAX OF 3 DOSES AS NEEDED FOR CHEST PAIN     No current facility-administered medications for this visit.     OBJECTIVE: Vitals:   05/08/18 1352 05/08/18 1357  BP:  106/67  Pulse:  70  Resp: 16   Temp:  97.6 F (36.4 C)     Body mass index is 38.77 kg/m.    ECOG FS:0 - Asymptomatic  General: Well-developed, well-nourished, no acute distress. Eyes: Pink conjunctiva, anicteric sclera. HEENT: Normocephalic, moist mucous membranes, clear oropharnyx. Lungs: Clear to auscultation bilaterally. Heart: Regular rate and rhythm. No rubs, murmurs, or gallops. Abdomen: Soft, nontender, nondistended. No organomegaly noted, normoactive bowel sounds. Musculoskeletal: No edema, cyanosis, or clubbing. Neuro: Alert, answering all questions appropriately. Cranial nerves grossly intact. Skin: No rashes or petechiae noted. Psych: Normal affect. Lymphatics: No cervical, calvicular, axillary or inguinal LAD.   LAB RESULTS:  Lab Results  Component Value Date   NA 139 11/20/2017   K 4.2 11/20/2017   CL 102 11/20/2017   CO2 29 11/20/2017   GLUCOSE 96 11/20/2017   BUN 16 11/20/2017   CREATININE 0.99 11/20/2017   CALCIUM 10.4 11/20/2017   PROT 7.5 11/20/2017   ALBUMIN 4.5 11/20/2017   AST 39 (H) 11/20/2017   ALT 50 (H) 11/20/2017   ALKPHOS 93 11/20/2017   BILITOT 0.7 11/20/2017    Lab Results  Component Value Date   WBC 6.0 05/08/2018   NEUTROABS 4.1 05/08/2018   HGB 13.2 05/08/2018   HCT 38.0 05/08/2018   MCV 87.5 05/08/2018   PLT 197 05/08/2018     STUDIES: Ct Angio Chest W/cm &/or Wo Cm  Result Date: 05/01/2018 CLINICAL DATA:  Shortness of breath for 4 months.  Elevated D-dimer. EXAM: CT ANGIOGRAPHY CHEST WITH CONTRAST TECHNIQUE: Multidetector CT imaging of the chest was  performed using the standard protocol during bolus administration of intravenous contrast. Multiplanar CT image reconstructions and MIPs were obtained to evaluate the vascular anatomy. CONTRAST:  75mL ISOVUE-370 IOPAMIDOL (ISOVUE-370) INJECTION 76% COMPARISON:  None. FINDINGS: Cardiovascular: Pulmonary arterial opacification is adequate without evidence of emboli. The thoracic aorta is normal in caliber. The heart is normal in size. There is no pericardial effusion. Mediastinum/Nodes: No enlarged axillary lymph nodes. A borderline enlarged right paratracheal lymph node measures 11 mm in short axis. There are numerous enlarged hilar lymph nodes bilaterally individually measuring up to approximately 11 mm in short axis. The esophagus is collapsed. The thyroid is unremarkable. Lungs/Pleura: No pleural effusion or pneumothorax. Mild respiratory motion artifact with minimal scattered dependent subpleural opacities bilaterally favored to represent atelectasis. No evidence of pneumonia, edema, or mass. Upper Abdomen: Status post cholecystectomy. Musculoskeletal: S-shaped  thoracolumbar scoliosis. No acute osseous abnormality or suspicious osseous lesion. Review of the MIP images confirms the above findings. IMPRESSION: 1. No evidence of pulmonary emboli. 2. Bilateral hilar lymphadenopathy, nonspecific. Sarcoidosis is a consideration, as are other inflammatory/granulomatous and infectious etiologies. Malignancy, including lymphoma, is also possible though is felt less likely given the paucity of mediastinal lymph node involvement. 3. No significant lung findings. Electronically Signed   By: Sebastian Ache M.D.   On: 05/01/2018 16:30   US Abdomen Limited Ruq  Result Date: 05/06/2018 CLINICAL DATA:  Elevated liver enzymes, previous cholecystectomy. EXAM: ULTRASOUND ABDOMEN LIMITED RIGHT UPPER QUADRANT COMPARISON:  None. FINDINGS: Gallbladder: The gallbladder is surgically absent. Common bile duct: Diameter: 4.2 mm Liver: The  hepatic echotexture is mildly increased diffusely. There is no focal mass nor ductal dilation. Portal vein is patent on color Doppler imaging with normal direction of blood flow towards the liver. IMPRESSION: Mildly increased hepatic echotexture most compatible with fatty infiltrative change. No suspicious masses or ductal dilation. Normal common bile duct caliber.  Previous cholecystectomy. Electronically Signed   By: David  [place] M.D.   On: 05/06/2018 11:19    ASSESSMENT: Hilar lymphadenopathy  PLAN:    1. Hilar lymphadenopathy: CT scan results from May 01, 2018 reviewed independently and report as above with bilateral hilar lymphadenopathy that is nonspecific.  Sarcoidosis is one possible etiology, but angiotensin-converting enzyme is within normal limits.  Lymph nodes are to small and nonspecific for biopsy or PET scan.  Peripheral blood flow cytometry was sent for completeness and is pending at time of dictation.  No intervention is needed at this time.  Agree with pulmonology and will repeat CT scan in 3 months to assess for interval change.  Patient return to clinic 1 to 2 days after her imaging to discuss the results.  I spent a total of 45 minutes face-to-face with the patient of which greater than 50% of the visit was spent in counseling and coordination of care as detailed above.   Patient expressed understanding and was in agreement with this plan. She also understands that She can call clinic at any time with any questions, concerns, or complaints.   Cancer Staging No matching staging information was found for the patient.  Jeralyn Ruths, MD   05/12/2018 8:05 AM

## 2018-05-06 ENCOUNTER — Encounter: Payer: Self-pay | Admitting: Internal Medicine

## 2018-05-06 ENCOUNTER — Ambulatory Visit
Admission: RE | Admit: 2018-05-06 | Discharge: 2018-05-06 | Disposition: A | Discharge status: Home / Self Care | Payer: BLUE CROSS/BLUE SHIELD | Source: Ambulatory Visit | Attending: Gastroenterology | Admitting: Gastroenterology

## 2018-05-06 ENCOUNTER — Ambulatory Visit: Payer: BLUE CROSS/BLUE SHIELD | Admitting: Internal Medicine

## 2018-05-06 VITALS — BP 124/88 | HR 64 | Ht 68.0 in | Wt 257.0 lb

## 2018-05-06 DIAGNOSIS — R591 Generalized enlarged lymph nodes: Secondary | ICD-10-CM | POA: Diagnosis not present

## 2018-05-06 DIAGNOSIS — Z9049 Acquired absence of other specified parts of digestive tract: Secondary | ICD-10-CM | POA: Diagnosis not present

## 2018-05-06 DIAGNOSIS — R0602 Shortness of breath: Secondary | ICD-10-CM | POA: Diagnosis not present

## 2018-05-06 DIAGNOSIS — Z1283 Encounter for screening for malignant neoplasm of skin: Secondary | ICD-10-CM | POA: Diagnosis not present

## 2018-05-06 DIAGNOSIS — R748 Abnormal levels of other serum enzymes: Secondary | ICD-10-CM | POA: Insufficient documentation

## 2018-05-06 DIAGNOSIS — G4733 Obstructive sleep apnea (adult) (pediatric): Secondary | ICD-10-CM

## 2018-05-06 DIAGNOSIS — L821 Other seborrheic keratosis: Secondary | ICD-10-CM | POA: Diagnosis not present

## 2018-05-06 DIAGNOSIS — R599 Enlarged lymph nodes, unspecified: Secondary | ICD-10-CM

## 2018-05-06 DIAGNOSIS — D229 Melanocytic nevi, unspecified: Secondary | ICD-10-CM | POA: Diagnosis not present

## 2018-05-06 DIAGNOSIS — L820 Inflamed seborrheic keratosis: Secondary | ICD-10-CM | POA: Diagnosis not present

## 2018-05-06 DIAGNOSIS — L720 Epidermal cyst: Secondary | ICD-10-CM | POA: Diagnosis not present

## 2018-05-06 DIAGNOSIS — R945 Abnormal results of liver function studies: Secondary | ICD-10-CM | POA: Diagnosis not present

## 2018-05-06 NOTE — Progress Notes (Signed)
Name: Annette Valencia MRN: [id number] DOB: 1965-09-15     CONSULTATION DATE: 8.28.19 REFERRING MD : Birdie Sons  CHIEF COMPLAINT: abnormal CT chest  STUDIES:   8.22.19   CT chest Independently reviewed by Me today B/l adenopathy Paratracheal adenopathy Interpretation-possible sarcoidosis  HISTORY OF PRESENT ILLNESS: 52 yo pleasant white female seen today for abnormal CT chest Patient with history pf progressive SOB intermittently for last 6 months Associated with ocassional coughing spells Denies wheezing Patient was admitted to [facility] 3 weeks ago for chest pain-according to patient she had extensive cardiac work up.  She subsequently had CT chest which revealed adenopathy but as per my assessment, the adenopathy is not that significant, the biggest LN is 1 CM rt paratracheal.  She has no fevers, chills, NVD  She is a former smoker 1 ppd for 10 years But had extensive second hand smoke exposure growing up  Patient also with underlying OSA on CPAP for last 5 years Patient is overweight and has gained significant weight over last several years  No signs of infection at this time No signs of CHF at this time     PAST MEDICAL HISTORY :   has a past medical history of Arthritis, Chicken pox, Frequent headaches, Hyperlipidemia, Hypertension, Hypothyroidism, Migraines, PONV (postoperative nausea and vomiting), Scoliosis, Sleep apnea, and UTI (urinary tract infection).  has a past surgical history that includes tubes tied (2000); Cesarean section (1988); Cervical ablation (2017); and Colonoscopy with propofol (N/A, 08/06/2017). Prior to Admission medications   Medication Sig Start Date End Date Taking? Authorizing Provider  atorvastatin (LIPITOR) 40 MG tablet Take 40 mg by mouth daily. 04/14/18 04/14/19 Yes [provider]  celecoxib (CELEBREX) 200 MG capsule TAKE 1 CAPSULE(200 MG) BY MOUTH DAILY 12/31/17  Yes Glori Luis, MD  fexofenadine (ALLEGRA) 180 MG tablet Take  1 tablet (180 mg total) by mouth daily. 06/19/16  Yes Cook, Jayce G, DO  fluticasone (FLONASE) 50 MCG/ACT nasal spray Place into both nostrils daily.   Yes [provider]  labetalol (NORMODYNE) 200 MG tablet Take 1 tablet (200 mg total) by mouth 2 (two) times daily. Patient taking differently: Take 100 mg by mouth 2 (two) times daily.  10/08/17  Yes Glori Luis, MD  levothyroxine (SYNTHROID, LEVOTHROID) 75 MCG tablet TAKE 1 TABLET(75 MCG) BY MOUTH DAILY 04/08/18  Yes Glori Luis, MD  losartan (COZAAR) 100 MG tablet TAKE 1 TABLET(100 MG) BY MOUTH DAILY Patient taking differently: 50 mg 2 (two) times daily.  12/03/17  Yes Glori Luis, MD  nitroGLYCERIN (NITROSTAT) 0.4 MG SL tablet DISSOLVE 1 TAB UNDER TONGUE EVERY 5 MINS TO MAX OF 3 DOSES AS NEEDED FOR CHEST PAIN 04/14/18 04/14/19 Yes [provider]   No Known Allergies  FAMILY HISTORY:  family history includes ALS in her father; Alcohol abuse in her father; Arthritis in her mother; Cancer in her mother; Hyperlipidemia in her mother; Hypertension in her father and maternal grandmother; Stroke in her father. SOCIAL HISTORY:  reports that she quit smoking about 26 years ago. She has never used smokeless tobacco. She reports that she drinks about 10.0 standard drinks of alcohol per week. She reports that she does not use drugs.  REVIEW OF SYSTEMS:   Constitutional: Negative for fever, chills, weight loss, malaise/fatigue and diaphoresis.  HENT: Negative for hearing loss, ear pain, nosebleeds, congestion, sore throat, neck pain, tinnitus and ear discharge.   Eyes: Negative for blurred vision, double vision, photophobia, pain, discharge and redness.  Respiratory:+  cough,- hemoptysis,- sputum production,+ shortness of breath, -wheezing and -stridor.   Cardiovascular: Negative for chest pain, palpitations, orthopnea, claudication, leg swelling and PND.  Gastrointestinal: Negative for heartburn, nausea, vomiting,  abdominal pain, diarrhea, constipation, blood in stool and melena.  Genitourinary: Negative for dysuria, urgency, frequency, hematuria and flank pain.  Musculoskeletal: Negative for myalgias, back pain, joint pain and falls.  Skin: Negative for itching and rash.  Neurological: Negative for dizziness, tingling, tremors, sensory change, speech change, focal weakness, seizures, loss of consciousness, weakness and headaches.  Endo/Heme/Allergies: Negative for environmental allergies and polydipsia. Does not bruise/bleed easily.  ALL OTHER ROS ARE NEGATIVE  BP 124/88 (BP Location: Left Arm, Cuff Size: Normal)   Pulse 64   Ht 5\' 8"  (1.727 m)   Wt 257 lb (116.6 kg)   SpO2 94%   BMI 39.08 kg/m    Physical Examination:   GENERAL:NAD, no fevers, chills, no weakness no fatigue HEAD: Normocephalic, atraumatic.  EYES: Pupils equal, round, reactive to light. Extraocular muscles intact. No scleral icterus.  MOUTH: Moist mucosal membrane.   EAR, NOSE, THROAT: Clear without exudates. No external lesions.  NECK: Supple. No thyromegaly. No nodules. No JVD.  PULMONARY:CTA B/L no wheezes, no crackles, no rhonchi CARDIOVASCULAR: S1 and S2. Regular rate and rhythm. No murmurs, rubs, or gallops. No edema.  GASTROINTESTINAL: Soft, nontender, nondistended. No masses. Positive bowel sounds.  MUSCULOSKELETAL: No swelling, clubbing, or edema. Range of motion full in all extremities.  NEUROLOGIC: Cranial nerves II through XII are intact. No gross focal neurological deficits.  SKIN: No ulceration, lesions, rashes, or cyanosis. Skin warm and dry. Turgor intact.  PSYCHIATRIC: Mood, affect within normal limits. The patient is awake, alert and oriented x 3. Insight, judgment intact.      ASSESSMENT / PLAN: 52 yo pleasant white female seen today for abnormal CT scan with b/l hilar adenopathy and paratracheal adenopathy with progressive SOB with history of smoking and second hand smoke exposure. Her SOB seems to be  out of proportion to the amount and and extent of adenopathy seen in CT scan.  Probable diagnosis is possible sarcoidosis and probable underlying COPD with OSA associated with obesity and deconditioned state.  After further discussion with patient, I did offer her diagnostic evaluation with EBUS and Bronchoscopy and with the size of the LN, she has decided to repeat CT chest in 3 months to assess for interval changes.  1.recommend PFT's to assess lung function-assess for obstructive airways disease   2.repeat CT chest in 3 months for interval assessment.  3.Obtain repeat sleep study as patient will need this ti to assess correct therapy with CPAP  4.Obesity -recommend significant weight loss -recommend changing diet  5.Deconditioned state -Recommend increased daily activity and exercise      Patient satisfied with Plan of action and management. All questions answered  Lucie LeatherKurian David Onyx Schirmer, M.D.  [facility]  Medical Director [facility] Medical Director [facility] Cardio-Pulmonary Department

## 2018-05-06 NOTE — Patient Instructions (Signed)
1. Obtain PFT's 2.get CT chest with contrast in 3 months 3.continue CPAP

## 2018-05-08 ENCOUNTER — Encounter: Payer: Self-pay | Admitting: Oncology

## 2018-05-08 ENCOUNTER — Inpatient Hospital Stay: Payer: BLUE CROSS/BLUE SHIELD

## 2018-05-08 ENCOUNTER — Inpatient Hospital Stay: Payer: BLUE CROSS/BLUE SHIELD | Attending: Oncology | Admitting: Oncology

## 2018-05-08 ENCOUNTER — Other Ambulatory Visit: Payer: Self-pay

## 2018-05-08 DIAGNOSIS — R590 Localized enlarged lymph nodes: Secondary | ICD-10-CM | POA: Insufficient documentation

## 2018-05-08 LAB — CBC WITH DIFFERENTIAL/PLATELET
BASOS ABS: 0 10*3/uL (ref 0–0.1)
BASOS PCT: 1 %
EOS PCT: 4 %
Eosinophils Absolute: 0.3 10*3/uL (ref 0–0.7)
HCT: 38 % (ref 35.0–47.0)
Hemoglobin: 13.2 g/dL (ref 12.0–16.0)
Lymphocytes Relative: 19 %
Lymphs Abs: 1.2 10*3/uL (ref 1.0–3.6)
MCH: 30.4 pg (ref 26.0–34.0)
MCHC: 34.8 g/dL (ref 32.0–36.0)
MCV: 87.5 fL (ref 80.0–100.0)
Monocytes Absolute: 0.5 10*3/uL (ref 0.2–0.9)
Monocytes Relative: 8 %
NEUTROS ABS: 4.1 10*3/uL (ref 1.4–6.5)
Neutrophils Relative %: 68 %
PLATELETS: 197 10*3/uL (ref 150–440)
RBC: 4.35 MIL/uL (ref 3.80–5.20)
RDW: 12.9 % (ref 11.5–14.5)
WBC: 6 10*3/uL (ref 3.6–11.0)

## 2018-05-08 LAB — LACTATE DEHYDROGENASE: LDH: 146 U/L (ref 98–192)

## 2018-05-08 NOTE — Progress Notes (Signed)
Patient here for initial visit. She has feeling of tightness in her chest, trouble on inspiration at times.

## 2018-05-09 ENCOUNTER — Telehealth: Payer: Self-pay | Admitting: *Deleted

## 2018-05-09 LAB — ANGIOTENSIN CONVERTING ENZYME: Angiotensin-Converting Enzyme: 56 U/L (ref 14–82)

## 2018-05-09 NOTE — Telephone Encounter (Signed)
Order was placed on 05/06/18 so we are going to schedule it now.

## 2018-05-09 NOTE — Telephone Encounter (Signed)
On your AVS you state PFT recommended. Pt states they aren't doing the biopsy and wants to do the PFT. Can she do it now or do you want her to wait until f/u?

## 2018-05-13 LAB — COMP PANEL: LEUKEMIA/LYMPHOMA

## 2018-05-21 ENCOUNTER — Telehealth: Payer: Self-pay | Admitting: Gastroenterology

## 2018-05-21 NOTE — Telephone Encounter (Signed)
PT left vm she states she missed a call but was unable to read her vm please return her call

## 2018-05-22 ENCOUNTER — Ambulatory Visit: Payer: BLUE CROSS/BLUE SHIELD

## 2018-05-22 NOTE — Telephone Encounter (Signed)
Pt is calling  about prev. message °

## 2018-05-23 NOTE — Telephone Encounter (Signed)
I sent pt the information via My Chart.

## 2018-05-29 ENCOUNTER — Ambulatory Visit (HOSPITAL_COMMUNITY): Payer: BLUE CROSS/BLUE SHIELD

## 2018-05-29 ENCOUNTER — Other Ambulatory Visit
Admission: RE | Admit: 2018-05-29 | Discharge: 2018-05-29 | Disposition: A | Discharge status: Home / Self Care | Payer: BLUE CROSS/BLUE SHIELD | Source: Ambulatory Visit | Attending: Gastroenterology | Admitting: Gastroenterology

## 2018-05-29 DIAGNOSIS — R748 Abnormal levels of other serum enzymes: Secondary | ICD-10-CM | POA: Insufficient documentation

## 2018-05-29 DIAGNOSIS — K760 Fatty (change of) liver, not elsewhere classified: Secondary | ICD-10-CM | POA: Insufficient documentation

## 2018-05-29 DIAGNOSIS — R0602 Shortness of breath: Secondary | ICD-10-CM | POA: Diagnosis not present

## 2018-05-29 LAB — COMPREHENSIVE METABOLIC PANEL
ALBUMIN: 4.4 g/dL (ref 3.5–5.0)
ALT: 42 U/L (ref 0–44)
ANION GAP: 8 (ref 5–15)
AST: 32 U/L (ref 15–41)
Alkaline Phosphatase: 108 U/L (ref 38–126)
BUN: 17 mg/dL (ref 6–20)
CHLORIDE: 105 mmol/L (ref 98–111)
CO2: 26 mmol/L (ref 22–32)
Calcium: 9.6 mg/dL (ref 8.9–10.3)
Creatinine, Ser: 0.88 mg/dL (ref 0.44–1.00)
GFR calc non Af Amer: >60 mL/min (ref >60)
Glucose, Bld: 114 mg/dL — ABNORMAL HIGH (ref 70–99)
Potassium: 3.9 mmol/L (ref 3.5–5.1)
SODIUM: 139 mmol/L (ref 135–145)
Total Bilirubin: 0.6 mg/dL (ref 0.3–1.2)
Total Protein: 7.4 g/dL (ref 6.5–8.1)

## 2018-05-29 LAB — CBC
HEMATOCRIT: 38.3 % (ref 35.0–47.0)
HEMOGLOBIN: 13.1 g/dL (ref 12.0–16.0)
MCH: 30.3 pg (ref 26.0–34.0)
MCHC: 34.3 g/dL (ref 32.0–36.0)
MCV: 88.5 fL (ref 80.0–100.0)
PLATELETS: 190 10*3/uL (ref 150–440)
RBC: 4.33 MIL/uL (ref 3.80–5.20)
RDW: 13.3 % (ref 11.5–14.5)
WBC: 6.3 10*3/uL (ref 3.6–11.0)

## 2018-05-29 LAB — PROTIME-INR
INR: 0.94
PROTHROMBIN TIME: 12.5 s (ref 11.4–15.2)

## 2018-05-29 LAB — FERRITIN: FERRITIN: 38 ng/mL (ref 11–307)

## 2018-05-29 MED ORDER — ALBUTEROL SULFATE (2.5 MG/3ML) 0.083% IN NEBU
2.5000 mg | INHALATION_SOLUTION | Freq: Once | RESPIRATORY_TRACT | Status: AC
  Administered 2018-05-29: 2.5 mg via RESPIRATORY_TRACT
  Filled 2018-05-29: qty 3

## 2018-05-30 LAB — HEPATITIS B SURFACE ANTIGEN: HEP B S AG: NEGATIVE

## 2018-05-30 LAB — HCV COMMENT:

## 2018-05-30 LAB — HEPATITIS B SURFACE ANTIBODY,QUALITATIVE: Hep B S Ab: NONREACTIVE

## 2018-05-30 LAB — MITOCHONDRIAL/SMOOTH MUSCLE AB PNL
F-Actin IgG: 11 Units (ref 0–19)
Mitochondrial M2 Ab, IgG: <20 Units (ref 0.0–20.0)

## 2018-05-30 LAB — HEPATITIS A ANTIBODY, TOTAL: Hep A Total Ab: NEGATIVE

## 2018-05-30 LAB — CERULOPLASMIN: Ceruloplasmin: 32.6 mg/dL (ref 19.0–39.0)

## 2018-05-30 LAB — HEPATITIS C ANTIBODY (REFLEX): HCV Ab: <0.1 s/co ratio (ref 0.0–0.9)

## 2018-05-30 LAB — HEPATITIS B CORE ANTIBODY, TOTAL: Hep B Core Total Ab: NEGATIVE

## 2018-06-13 ENCOUNTER — Telehealth: Payer: Self-pay

## 2018-06-13 ENCOUNTER — Ambulatory Visit: Payer: BLUE CROSS/BLUE SHIELD | Attending: Internal Medicine

## 2018-06-13 DIAGNOSIS — G4733 Obstructive sleep apnea (adult) (pediatric): Secondary | ICD-10-CM | POA: Insufficient documentation

## 2018-06-13 DIAGNOSIS — G4761 Periodic limb movement disorder: Secondary | ICD-10-CM | POA: Insufficient documentation

## 2018-06-13 NOTE — Telephone Encounter (Signed)
Pt notified of lab results and of fatty liver that weight loss, diet, exercise, and decreasing alcohol intake would help reverse this.

## 2018-06-13 NOTE — Telephone Encounter (Signed)
-----   Message from Pasty Spillers, MD sent at 06/12/2018  4:32 PM EDT ----- Eunice Blase please let patient know, her lab work was reassuring.  Her liver enzymes are normal at this time.  The rest of her viral hepatitis autoimmune hepatitis work-up was negative.  She is not immune to hepatitis A or B and should get vaccination for this.  She can do this at her primary care office.  She does have fatty liver ultrasound and as we discussed, weight loss, diet and exercise and decreasing alcohol intake would help reverse this.

## 2018-06-17 DIAGNOSIS — G4733 Obstructive sleep apnea (adult) (pediatric): Secondary | ICD-10-CM | POA: Diagnosis not present

## 2018-06-20 ENCOUNTER — Telehealth: Payer: Self-pay | Admitting: *Deleted

## 2018-06-20 DIAGNOSIS — G4733 Obstructive sleep apnea (adult) (pediatric): Secondary | ICD-10-CM

## 2018-06-20 NOTE — Telephone Encounter (Signed)
Patient aware Orders placed Nothing further needed. 

## 2018-06-23 ENCOUNTER — Other Ambulatory Visit: Payer: Self-pay | Admitting: Family Medicine

## 2018-06-30 DIAGNOSIS — G4733 Obstructive sleep apnea (adult) (pediatric): Secondary | ICD-10-CM | POA: Diagnosis not present

## 2018-07-01 ENCOUNTER — Other Ambulatory Visit: Payer: Self-pay | Admitting: Gastroenterology

## 2018-07-01 DIAGNOSIS — E611 Iron deficiency: Secondary | ICD-10-CM

## 2018-07-02 DIAGNOSIS — G4733 Obstructive sleep apnea (adult) (pediatric): Secondary | ICD-10-CM | POA: Diagnosis not present

## 2018-07-04 ENCOUNTER — Other Ambulatory Visit: Payer: Self-pay | Admitting: Family Medicine

## 2018-07-24 ENCOUNTER — Encounter: Payer: Self-pay | Admitting: Obstetrics and Gynecology

## 2018-07-24 ENCOUNTER — Ambulatory Visit (INDEPENDENT_AMBULATORY_CARE_PROVIDER_SITE_OTHER): Payer: BLUE CROSS/BLUE SHIELD | Admitting: Obstetrics and Gynecology

## 2018-07-24 ENCOUNTER — Other Ambulatory Visit (HOSPITAL_COMMUNITY)
Admission: RE | Admit: 2018-07-24 | Discharge: 2018-07-24 | Disposition: A | Discharge status: Home / Self Care | Payer: BLUE CROSS/BLUE SHIELD | Source: Ambulatory Visit | Attending: Obstetrics and Gynecology | Admitting: Obstetrics and Gynecology

## 2018-07-24 VITALS — BP 118/80 | HR 62 | Ht 67.5 in | Wt 259.0 lb

## 2018-07-24 DIAGNOSIS — Z124 Encounter for screening for malignant neoplasm of cervix: Secondary | ICD-10-CM | POA: Insufficient documentation

## 2018-07-24 DIAGNOSIS — Z01419 Encounter for gynecological examination (general) (routine) without abnormal findings: Secondary | ICD-10-CM

## 2018-07-24 DIAGNOSIS — Z1151 Encounter for screening for human papillomavirus (HPV): Secondary | ICD-10-CM

## 2018-07-24 DIAGNOSIS — Z1239 Encounter for other screening for malignant neoplasm of breast: Secondary | ICD-10-CM

## 2018-07-24 NOTE — Progress Notes (Signed)
PCP: Glori Luis, MD   Chief Complaint  Patient presents with  . Gynecologic Exam    HPI:      Ms. Annette Valencia is a 52 y.o. G1P1001 who LMP was No LMP recorded. Patient has had an ablation., presents today for her NP > 3 yrs annual examination.  Her menses are absent with endometrial ablation 9/16. Dysmenorrhea none. She does not have intermenstrual bleeding. She does have tolerable vasomotor sx.   Sex activity: single partner, contraception - tubal ligation. She does not have vaginal dryness.  Last Pap: not recent; no hx of abn paps with bx/tx. Hx of STDs: none  Last mammogram: August 20, 2017  Results were: normal--routine follow-up in 12 months There is no FH of breast cancer. There is no FH of ovarian cancer. The patient does not do self-breast exams.  Colonoscopy: colonoscopy 2 years ago without abnormalities.  Pt unsure of when Repeat due.  Tobacco use: The patient denies current or previous tobacco use. Alcohol use: social drinker Exercise: moderately active  She does get adequate calcium but not Vitamin D in her diet.  Labs with PCP.   Past Medical History:  Diagnosis Date  . Arthritis    knees  . Chicken pox   . Frequent headaches   . Hyperlipidemia   . Hypertension   . Hypothyroidism   . Migraines   . PONV (postoperative nausea and vomiting)   . Scoliosis   . Sleep apnea    CPAP  . UTI (urinary tract infection)     Past Surgical History:  Procedure Laterality Date  . CERVICAL ABLATION  2017  . CESAREAN SECTION  1988  . CHOLECYSTECTOMY    . COLONOSCOPY WITH PROPOFOL N/A 08/06/2017   Procedure: COLONOSCOPY WITH PROPOFOL;  Surgeon: Pasty Spillers, MD;  Location[facility];  Service: Endoscopy;  Laterality: N/A;  please keep early  . tubes tied  2000    Family History  Problem Relation Age of Onset  . Arthritis Mother   . Cancer Mother        lung  . Hyperlipidemia Mother   . Alcohol abuse Father   . Stroke  Father   . Hypertension Father   . ALS Father   . Hypertension Maternal Grandmother     Social History   Socioeconomic History  . Marital status: Married    Spouse name: Not on file  . Number of children: Not on file  . Years of education: Not on file  . Highest education level: Not on file  Occupational History  . Not on file  Social Needs  . Financial resource strain: Not on file  . Food insecurity:    Worry: Not on file    Inability: Not on file  . Transportation needs:    Medical: Not on file    Non-medical: Not on file  Tobacco Use  . Smoking status: Former Smoker    Last attempt to quit: 04/18/1992    Years since quitting: 26.2  . Smokeless tobacco: Never Used  Substance and Sexual Activity  . Alcohol use: Yes    Alcohol/week: 10.0 standard drinks    Types: 10 Glasses of wine per week    Comment: wine  . Drug use: No  . Sexual activity: Yes    Partners: Male  Lifestyle  . Physical activity:    Days per week: Not on file    Minutes per session: Not on file  . Stress: Not on file  Relationships  . Social connections:    Talks on phone: Not on file    Gets together: Not on file    Attends religious service: Not on file    Active member of club or organization: Not on file    Attends meetings of clubs or organizations: Not on file    Relationship status: Not on file  . Intimate partner violence:    Fear of current or ex partner: Not on file    Emotionally abused: Not on file    Physically abused: Not on file    Forced sexual activity: Not on file  Other Topics Concern  . Not on file  Social History Narrative  . Not on file    Outpatient Medications Prior to Visit  Medication Sig Dispense Refill  . atorvastatin (LIPITOR) 40 MG tablet Take 40 mg by mouth daily.    . celecoxib (CELEBREX) 200 MG capsule TAKE 1 CAPSULE(200 MG) BY MOUTH DAILY 90 capsule 0  . fexofenadine (ALLEGRA) 180 MG tablet Take 1 tablet (180 mg total) by mouth daily. 90 tablet 1  .  fluocinonide (LIDEX) 0.05 % external solution Apply 1 mL topically 2 (two) times daily.  2  . fluticasone (FLONASE) 50 MCG/ACT nasal spray Place into both nostrils daily.    Marland Kitchen. labetalol (NORMODYNE) 200 MG tablet Take 1 tablet (200 mg total) by mouth 2 (two) times daily. (Patient taking differently: Take 100 mg by mouth 2 (two) times daily. ) 180 tablet 3  . levothyroxine (SYNTHROID, LEVOTHROID) 75 MCG tablet TAKE 1 TABLET(75 MCG) BY MOUTH DAILY 90 tablet 0  . losartan (COZAAR) 100 MG tablet TAKE 1 TABLET(100 MG) BY MOUTH DAILY (Patient taking differently: 50 mg 2 (two) times daily. ) 90 tablet 0  . losartan (COZAAR) 100 MG tablet TAKE 1 TABLET(100 MG) BY MOUTH DAILY 90 tablet 0  . nitroGLYCERIN (NITROSTAT) 0.4 MG SL tablet DISSOLVE 1 TAB UNDER TONGUE EVERY 5 MINS TO MAX OF 3 DOSES AS NEEDED FOR CHEST PAIN     No facility-administered medications prior to visit.      ROS:  Review of Systems  Constitutional: Negative for fatigue, fever and unexpected weight change.  Respiratory: Positive for shortness of breath. Negative for cough and wheezing.   Cardiovascular: Negative for chest pain, palpitations and leg swelling.  Gastrointestinal: Negative for blood in stool, constipation, diarrhea, nausea and vomiting.  Endocrine: Negative for cold intolerance, heat intolerance and polyuria.  Genitourinary: Negative for dyspareunia, dysuria, flank pain, frequency, genital sores, hematuria, menstrual problem, pelvic pain, urgency, vaginal bleeding, vaginal discharge and vaginal pain.  Musculoskeletal: Positive for arthralgias. Negative for back pain, joint swelling and myalgias.  Skin: Negative for rash.  Neurological: Negative for dizziness, syncope, light-headedness, numbness and headaches.  Hematological: Negative for adenopathy.  Psychiatric/Behavioral: Negative for agitation, confusion, sleep disturbance and suicidal ideas. The patient is not nervous/anxious.    BREAST: No  symptoms    Objective: BP 118/80   Pulse 62   Ht 5' 7.5" (1.715 m)   Wt 259 lb (117.5 kg)   BMI 39.97 kg/m    Physical Exam  Constitutional: She is oriented to person, place, and time. She appears well-developed and well-nourished.  Genitourinary: Vagina normal and uterus normal. There is no rash or tenderness on the right labia. There is no rash or tenderness on the left labia. No erythema or tenderness in the vagina. No vaginal discharge found. Right adnexum does not display mass and does not display tenderness. Left adnexum does  not display mass and does not display tenderness. Cervix does not exhibit motion tenderness or polyp. Uterus is not enlarged or tender.  Neck: Normal range of motion. No thyromegaly present.  Cardiovascular: Normal rate, regular rhythm and normal heart sounds.  No murmur heard. Pulmonary/Chest: Effort normal and breath sounds normal. Right breast exhibits no mass, no nipple discharge, no skin change and no tenderness. Left breast exhibits no mass, no nipple discharge, no skin change and no tenderness.  Abdominal: Soft. There is no tenderness. There is no guarding.  Musculoskeletal: Normal range of motion.  Neurological: She is alert and oriented to person, place, and time. No cranial nerve deficit.  Psychiatric: She has a normal mood and affect. Her behavior is normal.  Vitals reviewed.   Assessment/Plan:  Encounter for annual routine gynecological examination  Cervical cancer screening - Plan: Cytology - PAP  Screening for HPV (human papillomavirus) - Plan: Cytology - PAP  Screening for breast cancer - Pt to sched mammo - Plan: MM 3D SCREEN BREAST BILATERAL          GYN counsel mammography screening, menopause, adequate intake of calcium and vitamin D, diet and exercise    F/U  Return in about 1 year (around 07/25/2019).  Alicia B. Copland, PA-C 07/24/2018 2:43 PM

## 2018-07-24 NOTE — Patient Instructions (Signed)
I value your feedback and entrusting us with your care. If you get a [facility] patient survey, I would appreciate you taking the time to let us know about your experience today. Thank you!  [facility]: [phone]    

## 2018-07-28 LAB — CYTOLOGY - PAP
ADEQUACY: ABSENT
DIAGNOSIS: NEGATIVE

## 2018-07-31 DIAGNOSIS — G4733 Obstructive sleep apnea (adult) (pediatric): Secondary | ICD-10-CM | POA: Diagnosis not present

## 2018-08-03 NOTE — Progress Notes (Signed)
[facility]  [phone]) [phone] [phone]  ID: Annette GoodellKaren Valencia OB: [birth year]  MR#: [id number]  QIO#:[id number]#:[id number]  Patient Care Team: Glori LuisSonnenberg, Eric G, MD as PCP - General (Family Medicine)  CHIEF COMPLAINT: Hilar lymphadenopathy.  INTERVAL HISTORY: Patient returns to clinic today for further evaluation and discussion of her imaging results.  She currently feels well and is asymptomatic. She has no neurologic complaints.  She denies any fevers, chills, or weight loss.  She denies any chest pain, shortness of breath, cough, or hemoptysis.  She denies any nausea, vomiting, constipation, or diarrhea.  She has no urinary complaints.  Patient feels at her baseline offers no specific complaints today.  REVIEW OF SYSTEMS:   Review of Systems  Constitutional: Negative.  Negative for fever, malaise/fatigue and weight loss.  Respiratory: Negative.  Negative for cough, hemoptysis and shortness of breath.   Cardiovascular: Negative.  Negative for chest pain and leg swelling.  Gastrointestinal: Negative.  Negative for abdominal pain.  Genitourinary: Negative.  Negative for dysuria.  Musculoskeletal: Negative.  Negative for back pain.  Skin: Negative.  Negative for rash.  Neurological: Negative.  Negative for dizziness, focal weakness, weakness and headaches.  Psychiatric/Behavioral: Negative.  The patient is not nervous/anxious.     As per HPI. Otherwise, a complete review of systems is negative.  PAST MEDICAL HISTORY: Past Medical History:  Diagnosis Date  . Arthritis    knees  . Chicken pox   . Frequent headaches   . Hyperlipidemia   . Hypertension   . Hypothyroidism   . Migraines   . PONV (postoperative nausea and vomiting)   . Scoliosis   . Sleep apnea    CPAP  . UTI (urinary tract infection)     PAST SURGICAL HISTORY: Past Surgical History:  Procedure Laterality Date  . CERVICAL ABLATION  2017  . CESAREAN SECTION  1988  . CHOLECYSTECTOMY      . COLONOSCOPY WITH PROPOFOL N/A 08/06/2017   Procedure: COLONOSCOPY WITH PROPOFOL;  Surgeon: Pasty Spillersahiliani, Varnita B, MD;  [facility];  Service: Endoscopy;  Laterality: N/A;  please keep early  . tubes tied  2000    FAMILY HISTORY: Family History  Problem Relation Age of Onset  . Arthritis Mother   . Cancer Mother        lung  . Hyperlipidemia Mother   . Alcohol abuse Father   . Stroke Father   . Hypertension Father   . ALS Father   . Hypertension Maternal Grandmother     ADVANCED DIRECTIVES (Y/N):  N  HEALTH MAINTENANCE: Social History   Tobacco Use  . Smoking status: Former Smoker    Last attempt to quit: 04/18/1992    Years since quitting: 26.3  . Smokeless tobacco: Never Used  Substance Use Topics  . Alcohol use: Yes    Alcohol/week: 10.0 standard drinks    Types: 10 Glasses of wine per week    Comment: wine  . Drug use: No     Colonoscopy:  PAP:  Bone density:  Lipid panel:  No Known Allergies  Current Outpatient Medications  Medication Sig Dispense Refill  . atorvastatin (LIPITOR) 40 MG tablet Take 40 mg by mouth daily.    . celecoxib (CELEBREX) 200 MG capsule TAKE 1 CAPSULE(200 MG) BY MOUTH DAILY 90 capsule 0  . fexofenadine (ALLEGRA) 180 MG tablet Take 1 tablet (180 mg total) by mouth daily. 90 tablet 1  . fluocinonide (LIDEX) 0.05 % external solution Apply 1 mL topically 2 (  two) times daily.  2  . fluticasone (FLONASE) 50 MCG/ACT nasal spray Place into both nostrils daily.    Marland Kitchen labetalol (NORMODYNE) 200 MG tablet Take 1 tablet (200 mg total) by mouth 2 (two) times daily. (Patient taking differently: Take 100 mg by mouth 2 (two) times daily. ) 180 tablet 3  . levothyroxine (SYNTHROID, LEVOTHROID) 75 MCG tablet TAKE 1 TABLET(75 MCG) BY MOUTH DAILY 90 tablet 0  . losartan (COZAAR) 100 MG tablet TAKE 1 TABLET(100 MG) BY MOUTH DAILY (Patient taking differently: 50 mg 2 (two) times daily. ) 90 tablet 0  . pantoprazole (PROTONIX) 40 MG tablet  Take 1 tablet (40 mg total) by mouth daily. 30 tablet 3   No current facility-administered medications for this visit.     OBJECTIVE: Vitals:   08/05/18 1053  BP: 119/82  Temp: (!) 97.4 F (36.3 C)     Body mass index is 40.89 kg/m.    ECOG FS:0 - Asymptomatic  General: Well-developed, well-nourished, no acute distress. Eyes: Pink conjunctiva, anicteric sclera. HEENT: Normocephalic, moist mucous membranes, clear oropharnyx. Lungs: Clear to auscultation bilaterally. Heart: Regular rate and rhythm. No rubs, murmurs, or gallops. Abdomen: Soft, nontender, nondistended. No organomegaly noted, normoactive bowel sounds. Musculoskeletal: No edema, cyanosis, or clubbing. Neuro: Alert, answering all questions appropriately. Cranial nerves grossly intact. Skin: No rashes or petechiae noted. Psych: Normal affect. Lymphatics: No cervical, calvicular, axillary or inguinal LAD.  LAB RESULTS:  Lab Results  Component Value Date   NA 139 05/29/2018   K 3.9 05/29/2018   CL 105 05/29/2018   CO2 26 05/29/2018   GLUCOSE 114 (H) 05/29/2018   BUN 17 05/29/2018   CREATININE 0.90 08/04/2018   CALCIUM 9.6 05/29/2018   PROT 7.4 05/29/2018   ALBUMIN 4.4 05/29/2018   AST 32 05/29/2018   ALT 42 05/29/2018   ALKPHOS 108 05/29/2018   BILITOT 0.6 05/29/2018   GFRNONAA >60 05/29/2018   GFRAA >60 05/29/2018    Lab Results  Component Value Date   WBC 6.3 05/29/2018   NEUTROABS 4.1 05/08/2018   HGB 13.1 05/29/2018   HCT 38.3 05/29/2018   MCV 88.5 05/29/2018   PLT 190 05/29/2018     STUDIES: Ct Chest W Contrast  Result Date: 08/04/2018 CLINICAL DATA:  Lung nodule.  Chest pressure shortness of breath. EXAM: CT CHEST WITH CONTRAST TECHNIQUE: Multidetector CT imaging of the chest was performed during intravenous contrast administration. CONTRAST:  75mL ISOVUE-300 IOPAMIDOL (ISOVUE-300) INJECTION 61% COMPARISON:  05/01/2018. FINDINGS: Cardiovascular: Vascular structures are unremarkable. Heart is  at the upper limits of normal in size to mildly enlarged. No pericardial effusion. Mediastinum/Nodes: Mediastinal lymph nodes measure up to 1.3 cm in the low right paratracheal station, as before. Hilar lymph nodes measure up to 11 mm bilaterally, including likely intrapulmonary lymph nodes in the right suprahilar region, stable. No axillary adenopathy. Esophagus is grossly unremarkable. Lungs/Pleura: Image quality is somewhat degraded by body habitus. Lungs are clear. Again, right suprahilar nodular densities (series 3, images 50 and 54) are favored to be intrapulmonary lymph nodes rather than pulmonary nodules. No pleural fluid. Airway is unremarkable. Upper Abdomen: Visualized portions of the liver, adrenal glands and right kidney are unremarkable. 12 mm low-attenuation lesion in the interpolar left kidney is incompletely imaged. Visualized portions of the spleen, pancreas, stomach and bowel are grossly unremarkable. Cholecystectomy. No upper abdominal adenopathy. Musculoskeletal: Degenerative changes in the spine. No worrisome lytic or sclerotic lesions. IMPRESSION: Borderline enlarged mediastinal and bihilar lymph nodes, stable from 05/01/2018. Findings  can be seen with sarcoid. A lymphoproliferative disorder can not be excluded. Electronically Signed   By: Leanna Battles M.D.   On: 08/04/2018 11:03    ASSESSMENT: Hilar lymphadenopathy  PLAN:    1. Hilar lymphadenopathy: CT scan results from August 04, 2018 reviewed independently and reported as above with borderline enlarged mediastinal and bihilar lymph nodes essentially unchanged from previous.  Patient is asymptomatic.  Previously, peripheral blood flow cytometry was negative.  Sarcoidosis is one possible etiology, but angiotensin-converting enzyme is within normal limits.  Lymph nodes are to small and nonspecific for biopsy or PET scan.  No intervention is needed at this time.  Patient has requested no further follow-up.  Can consider repeat CT  scan of chest in 6 to 12 months which can be ordered by her primary care physician.  Please refer patient back if there are any questions or concerns.  I spent a total of 20 minutes face-to-face with the patient of which greater than 50% of the visit was spent in counseling and coordination of care as detailed above.   Patient expressed understanding and was in agreement with this plan. She also understands that She can call clinic at any time with any questions, concerns, or complaints.    Jeralyn Ruths, MD   08/10/2018 10:48 AM

## 2018-08-04 ENCOUNTER — Ambulatory Visit
Admission: RE | Admit: 2018-08-04 | Discharge: 2018-08-04 | Disposition: A | Discharge status: Home / Self Care | Payer: BLUE CROSS/BLUE SHIELD | Source: Ambulatory Visit | Attending: Oncology | Admitting: Oncology

## 2018-08-04 DIAGNOSIS — R590 Localized enlarged lymph nodes: Secondary | ICD-10-CM | POA: Diagnosis not present

## 2018-08-04 DIAGNOSIS — R911 Solitary pulmonary nodule: Secondary | ICD-10-CM | POA: Diagnosis not present

## 2018-08-04 LAB — POCT I-STAT CREATININE: CREATININE: 0.9 mg/dL (ref 0.44–1.00)

## 2018-08-04 MED ORDER — IOPAMIDOL (ISOVUE-300) INJECTION 61%
75.0000 mL | Freq: Once | INTRAVENOUS | Status: AC | PRN
  Administered 2018-08-04: 75 mL via INTRAVENOUS

## 2018-08-05 ENCOUNTER — Ambulatory Visit: Payer: BLUE CROSS/BLUE SHIELD | Admitting: Oncology

## 2018-08-05 ENCOUNTER — Encounter: Payer: Self-pay | Admitting: Oncology

## 2018-08-05 ENCOUNTER — Other Ambulatory Visit: Payer: Self-pay

## 2018-08-05 ENCOUNTER — Inpatient Hospital Stay: Payer: BLUE CROSS/BLUE SHIELD | Attending: Oncology | Admitting: Oncology

## 2018-08-05 VITALS — BP 119/82 | Temp 97.4°F | Wt 265.0 lb

## 2018-08-05 DIAGNOSIS — I10 Essential (primary) hypertension: Secondary | ICD-10-CM | POA: Diagnosis not present

## 2018-08-05 DIAGNOSIS — Z79899 Other long term (current) drug therapy: Secondary | ICD-10-CM

## 2018-08-05 DIAGNOSIS — R590 Localized enlarged lymph nodes: Secondary | ICD-10-CM | POA: Diagnosis not present

## 2018-08-05 DIAGNOSIS — Z9049 Acquired absence of other specified parts of digestive tract: Secondary | ICD-10-CM | POA: Diagnosis not present

## 2018-08-05 DIAGNOSIS — Z87891 Personal history of nicotine dependence: Secondary | ICD-10-CM

## 2018-08-05 NOTE — Progress Notes (Signed)
Patient is here to follow up on hilar lymphadenopathy. Patient stated that she does not have any concerns today.

## 2018-08-06 ENCOUNTER — Ambulatory Visit (INDEPENDENT_AMBULATORY_CARE_PROVIDER_SITE_OTHER): Payer: BLUE CROSS/BLUE SHIELD | Admitting: Internal Medicine

## 2018-08-06 ENCOUNTER — Encounter: Payer: Self-pay | Admitting: Internal Medicine

## 2018-08-06 VITALS — BP 124/80 | HR 76 | Ht 67.5 in | Wt 259.0 lb

## 2018-08-06 DIAGNOSIS — R599 Enlarged lymph nodes, unspecified: Secondary | ICD-10-CM

## 2018-08-06 DIAGNOSIS — G4733 Obstructive sleep apnea (adult) (pediatric): Secondary | ICD-10-CM | POA: Diagnosis not present

## 2018-08-06 DIAGNOSIS — R591 Generalized enlarged lymph nodes: Secondary | ICD-10-CM

## 2018-08-06 DIAGNOSIS — K219 Gastro-esophageal reflux disease without esophagitis: Secondary | ICD-10-CM

## 2018-08-06 MED ORDER — PANTOPRAZOLE SODIUM 40 MG PO TBEC: 40.0000 mg | DELAYED_RELEASE_TABLET | Freq: Every day | ORAL | 3 refills | Status: DC

## 2018-08-06 NOTE — Progress Notes (Signed)
Name: Annette Valencia MRN: [id number] DOB: [birth year]     CONSULTATION DATE: 8.28.19 REFERRING MD : Birdie Sons  CHIEF COMPLAINT: follow up abnormal CT chest STUDIES:   8.22.19   CT chest Independently reviewed by Me today B/l adenopathy Paratracheal adenopathy Interpretation-possible sarcoidosis  08/04/2018 CT chest independently reviewed by me today Bilateral adenopathy Right paratracheal adenopathy stable over last 3 months No pneumonia no effusions no infiltrates Interpretation possible sarcoid  05/29/2018 pulmonary function test Ratio 90% predicted FEV1 96% predicted TLC is 88% predicted Interpretation normal spirometry normal lung volumes     HISTORY OF PRESENT ILLNESS: Patient here is for follow-up test results including pulmonary function testing and CT chest result I have reviewed pulmonary function testing with the patient-there is no evidence of obstructive or restrictive lung disease  CT chest results reviewed with Adenopathy is stable over the last 3 months We have discussed follow-up CT scans in 1 year to assess for interval progression  Patient has a history of OSA Currently on nasal mask CPAP At this time I do not have a compliance report I will need to establish patient into the system to obtain a compliance report Patient states that she is very satisfied with her therapy However sometimes her mask does not fit well I suggested mask fitting referral  After further discussion with patient she has signs and symptoms of reflux disease Sometimes she has coughing episodes when she eats or drinks water Burning sensation intermittently Is not on any reflux medicines or therapy  Former smoker 1 pack a day for 10 years Extensive secondhand smoke exposure as a child   No signs of infection at This time no signs of heart failure at this time    Prior to Admission medications   Medication Sig Start Date End Date Taking? Authorizing Provider    atorvastatin (LIPITOR) 40 MG tablet Take 40 mg by mouth daily. 04/14/18 04/14/19 Yes [provider]  celecoxib (CELEBREX) 200 MG capsule TAKE 1 CAPSULE(200 MG) BY MOUTH DAILY 12/31/17  Yes Glori Luis, MD  fexofenadine (ALLEGRA) 180 MG tablet Take 1 tablet (180 mg total) by mouth daily. 06/19/16  Yes Cook, Jayce G, DO  fluticasone (FLONASE) 50 MCG/ACT nasal spray Place into both nostrils daily.   Yes [provider]  labetalol (NORMODYNE) 200 MG tablet Take 1 tablet (200 mg total) by mouth 2 (two) times daily. Patient taking differently: Take 100 mg by mouth 2 (two) times daily.  10/08/17  Yes Glori Luis, MD  levothyroxine (SYNTHROID, LEVOTHROID) 75 MCG tablet TAKE 1 TABLET(75 MCG) BY MOUTH DAILY 04/08/18  Yes Glori Luis, MD  losartan (COZAAR) 100 MG tablet TAKE 1 TABLET(100 MG) BY MOUTH DAILY Patient taking differently: 50 mg 2 (two) times daily.  12/03/17  Yes Glori Luis, MD  nitroGLYCERIN (NITROSTAT) 0.4 MG SL tablet DISSOLVE 1 TAB UNDER TONGUE EVERY 5 MINS TO MAX OF 3 DOSES AS NEEDED FOR CHEST PAIN 04/14/18 04/14/19 Yes [provider]   No Known Allergies  FAMILY HISTORY:  family history includes ALS in her father; Alcohol abuse in her father; Arthritis in her mother; Cancer in her mother; Hyperlipidemia in her mother; Hypertension in her father and maternal grandmother; Stroke in her father. SOCIAL HISTORY:  reports that she quit smoking about 26 years ago. She has never used smokeless tobacco. She reports that she drinks about 10.0 standard drinks of alcohol per week. She reports that she does not use drugs.   Review of Systems:  Gen:  Denies  fever, sweats, chills weigh loss  HEENT: Denies blurred vision, double vision, ear pain, eye pain, hearing loss, nose bleeds, sore throat Cardiac:  No dizziness, chest pain or heaviness, chest tightness,edema, No JVD Resp:   + cough, -sputum production, -shortness of breath,-wheezing, -hemoptysis,   Gi: Denies swallowing difficulty, stomach pain, nausea or vomiting, diarrhea, constipation, bowel incontinence Gu:  Denies bladder incontinence, burning urine Ext:   Denies Joint pain, stiffness or swelling Skin: Denies  skin rash, easy bruising or bleeding or hives Endoc:  Denies polyuria, polydipsia , polyphagia or weight change Psych:   Denies depression, insomnia or hallucinations  Other:  All other systems negative   BP 124/80 (BP Location: Left Arm, Cuff Size: Normal)   Pulse 76   Ht 5' 7.5" (1.715 m)   Wt 259 lb (117.5 kg)   SpO2 94%   BMI 39.97 kg/m    Physical Examination:   GENERAL:NAD, no fevers, chills, no weakness no fatigue HEAD: Normocephalic, atraumatic.  EYES: Pupils equal, round, reactive to light. Extraocular muscles intact. No scleral icterus.  MOUTH: Moist mucosal membrane. Dentition intact. No abscess noted.  EAR, NOSE, THROAT: Clear without exudates. No external lesions.  NECK: Supple. No thyromegaly. No nodules. No JVD.  PULMONARY: CTA B/L no wheezing, rhonchi, crackles CARDIOVASCULAR: S1 and S2. Regular rate and rhythm. No murmurs, rubs, or gallops. No edema. Pedal pulses 2+ bilaterally.  GASTROINTESTINAL: Soft, nontender, nondistended. No masses. Positive bowel sounds. No hepatosplenomegaly.  MUSCULOSKELETAL: No swelling, clubbing, or edema. Range of motion full in all extremities.  NEUROLOGIC: Cranial nerves II through XII are intact. No gross focal neurological deficits. Sensation intact. Reflexes intact.  SKIN: No ulceration, lesions, rashes, or cyanosis. Skin warm and dry. Turgor intact.  PSYCHIATRIC: Mood, affect within normal limits. The patient is awake, alert and oriented x 3. Insight, judgment intact.  ALL OTHER ROS ARE NEGATIVE         ASSESSMENT / PLAN: 52 year old pleasant white female seen today for follow-up abnormal CT scan with bilateral hilar adenopathy and right paratracheal adenopathy that has not significantly changed over the  last several months in the setting of secondhand smoke exposure and former smoker-at this time she may still have the possibility of underlying sarcoidosis however after further discussion with the patient she is comfortable for repeat CT scan in 1 year for interval progression and assessment. She has no significant respiratory issues at this time.  After further discussion patient does have some intermittent shortness of breath and some midsternal chest pressure with some burning sensation which goes along with reflux disease in the setting of chronic NSAID use of Celebrex for right knee pain-I have suggested that she start Protonix therapy daily  OSA Continue nasal CPAP therapy she states that her mask does not fit well sometimes I [facility] needed   Obesity -recommend significant weight loss -recommend changing diet  Deconditioned state -Recommend increased daily activity and exercise    Patient  satisfied with Plan of action and management. All questions answered  Lucie LeatherKurian David Jeslin Bazinet, M.D.  [facility]  Medical Director [facility] Medical Director [facility] Cardio-Pulmonary Department

## 2018-08-06 NOTE — Patient Instructions (Signed)
GERD-start Protonix  Continue CPAP Nasal Mask Patient needs Mask fitting referral    Follow up CT chest in 1 year

## 2018-08-19 ENCOUNTER — Ambulatory Visit: Payer: BLUE CROSS/BLUE SHIELD | Admitting: Gastroenterology

## 2018-08-19 ENCOUNTER — Other Ambulatory Visit (HOSPITAL_BASED_OUTPATIENT_CLINIC_OR_DEPARTMENT_OTHER): Payer: BLUE CROSS/BLUE SHIELD

## 2018-08-30 DIAGNOSIS — G4733 Obstructive sleep apnea (adult) (pediatric): Secondary | ICD-10-CM | POA: Diagnosis not present

## 2018-09-02 ENCOUNTER — Other Ambulatory Visit: Payer: Self-pay | Admitting: Family Medicine

## 2018-10-02 ENCOUNTER — Other Ambulatory Visit: Payer: Self-pay | Admitting: Family Medicine

## 2018-10-06 DIAGNOSIS — G4733 Obstructive sleep apnea (adult) (pediatric): Secondary | ICD-10-CM | POA: Diagnosis not present

## 2018-11-13 DIAGNOSIS — E7800 Pure hypercholesterolemia, unspecified: Secondary | ICD-10-CM | POA: Diagnosis not present

## 2018-11-13 DIAGNOSIS — I10 Essential (primary) hypertension: Secondary | ICD-10-CM | POA: Diagnosis not present

## 2018-11-13 DIAGNOSIS — G4733 Obstructive sleep apnea (adult) (pediatric): Secondary | ICD-10-CM | POA: Diagnosis not present

## 2018-11-13 DIAGNOSIS — R600 Localized edema: Secondary | ICD-10-CM | POA: Diagnosis not present

## 2018-11-23 ENCOUNTER — Other Ambulatory Visit: Payer: Self-pay | Admitting: Family Medicine

## 2018-11-23 ENCOUNTER — Other Ambulatory Visit: Payer: Self-pay | Admitting: Internal Medicine

## 2018-11-23 DIAGNOSIS — K219 Gastro-esophageal reflux disease without esophagitis: Secondary | ICD-10-CM

## 2018-12-31 ENCOUNTER — Other Ambulatory Visit: Payer: Self-pay | Admitting: Family Medicine

## 2019-01-16 DIAGNOSIS — G4733 Obstructive sleep apnea (adult) (pediatric): Secondary | ICD-10-CM | POA: Diagnosis not present

## 2019-02-21 ENCOUNTER — Other Ambulatory Visit: Payer: Self-pay | Admitting: Family Medicine

## 2019-02-25 ENCOUNTER — Telehealth: Payer: Self-pay | Admitting: Family Medicine

## 2019-02-25 NOTE — Telephone Encounter (Signed)
Pt has not been seen in a year appt scheduled for this Friday to get refills.  Maybel Dambrosio,cma

## 2019-02-25 NOTE — Telephone Encounter (Signed)
I left a message to inform the patient her appointment time on 6.19.20 has been changed from 3:15 to a 1:45 virtual visit.,

## 2019-02-27 ENCOUNTER — Ambulatory Visit (INDEPENDENT_AMBULATORY_CARE_PROVIDER_SITE_OTHER): Payer: BC Managed Care – PPO | Admitting: Family Medicine

## 2019-02-27 ENCOUNTER — Other Ambulatory Visit: Payer: Self-pay

## 2019-02-27 DIAGNOSIS — R590 Localized enlarged lymph nodes: Secondary | ICD-10-CM | POA: Diagnosis not present

## 2019-02-27 DIAGNOSIS — R0609 Other forms of dyspnea: Secondary | ICD-10-CM | POA: Diagnosis not present

## 2019-02-27 DIAGNOSIS — E039 Hypothyroidism, unspecified: Secondary | ICD-10-CM

## 2019-02-27 DIAGNOSIS — R0600 Dyspnea, unspecified: Secondary | ICD-10-CM

## 2019-02-27 DIAGNOSIS — I10 Essential (primary) hypertension: Secondary | ICD-10-CM | POA: Diagnosis not present

## 2019-02-27 DIAGNOSIS — Z6839 Body mass index (BMI) 39.0-39.9, adult: Secondary | ICD-10-CM

## 2019-02-27 MED ORDER — ATORVASTATIN CALCIUM 40 MG PO TABS: 40.0000 mg | ORAL_TABLET | Freq: Every day | ORAL | 3 refills | Status: DC

## 2019-02-27 MED ORDER — LOSARTAN POTASSIUM 100 MG PO TABS: ORAL_TABLET | ORAL | 0 refills | Status: DC

## 2019-02-27 MED ORDER — CELECOXIB 200 MG PO CAPS: ORAL_CAPSULE | ORAL | 0 refills | Status: DC

## 2019-02-27 MED ORDER — LEVOTHYROXINE SODIUM 75 MCG PO TABS: ORAL_TABLET | ORAL | 0 refills | Status: DC

## 2019-02-27 NOTE — Progress Notes (Signed)
Virtual Visit via video Note  This visit type was conducted due to national recommendations for restrictions regarding the COVID-19 pandemic (e.g. social distancing).  This format is felt to be most appropriate for this patient at this time.  All issues noted in this document were discussed and addressed.  No physical exam was performed (except for noted visual exam findings with Video Visits).   I connected with Annette Valencia today at  1:45 PM EDT by a video enabled telemedicine application and telephone and verified that I am speaking with the correct person using two identifiers. Location patient: home Location provider: work  Persons participating in the virtual visit: patient, provider  I discussed the limitations, risks, security and privacy concerns of performing an evaluation and management service by telephone and the availability of in person appointments. I also discussed with the patient that there may be a patient responsible charge related to this service. The patient expressed understanding and agreed to proceed.  Interactive audio and video telecommunications were attempted between this provider and patient, however failed, due to patient having technical difficulties OR patient did not have access to video capability.  We were able to complete 15 minutes of the visit via video and then had to convert the visit to a telephone visit for 17 minutes.  We continued and completed visit with audio only.    Reason for visit: follow-up  HPI: Hypertension: Has been running right around the 272 systolically.  Taking losartan.  She is taking half a tablet of labetalol twice daily.  She continues to have some intermittent chest discomfort and chronic stable dyspnea on exertion.  She has been evaluated by cardiology and pulmonology for those things and she believes it may be weight related.  Had some small amount of edema in her bilateral feet.  Occasionally will get lightheaded first  thing in the morning.  Hypothyroidism: Taking Synthroid.  No skin changes.  Heat or cold intolerance.  She does note occasional hot flashes that have been going on for a few years.  Obesity: Patient notes she continues to gain weight.  She eats very little carbohydrates.  She typically will have a yogurt for breakfast and then a chef salad or other kind of salad with a light dressing for lunch.  She will have meat and a potato and vegetable for dinner.  She is not very active and does note she sits at work.  She does swim some.  She does not snack.  She does not drink soda or sweet tea.  She does drink 2 glasses of wine per day.  Hilar adenopathy: She underwent evaluation with hematology and is currently following with pulmonology.  She had negative work-up.  Repeat CT chest has been ordered by pulmonology.     ROS: See pertinent positives and negatives per HPI.  Past Medical History:  Diagnosis Date  . Arthritis    knees  . Chicken pox   . Frequent headaches   . Hyperlipidemia   . Hypertension   . Hypothyroidism   . Migraines   . PONV (postoperative nausea and vomiting)   . Scoliosis   . Sleep apnea    CPAP  . UTI (urinary tract infection)     Past Surgical History:  Procedure Laterality Date  . CERVICAL ABLATION  2017  . CESAREAN SECTION  1988  . CHOLECYSTECTOMY    . COLONOSCOPY WITH PROPOFOL N/A 08/06/2017   Procedure: COLONOSCOPY WITH PROPOFOL;  Surgeon: Virgel Manifold, MD;  Location:  [facility];  Service: Endoscopy;  Laterality: N/A;  please keep early  . tubes tied  2000    Family History  Problem Relation Age of Onset  . Arthritis Mother   . Cancer Mother        lung  . Hyperlipidemia Mother   . Alcohol abuse Father   . Stroke Father   . Hypertension Father   . ALS Father   . Hypertension Maternal Grandmother     SOCIAL HX: Former smoker   Current Outpatient Medications:  .  atorvastatin (LIPITOR) 40 MG tablet, Take 1 tablet (40 mg total)  by mouth daily., Disp: 90 tablet, Rfl: 3 .  celecoxib (CELEBREX) 200 MG capsule, TAKE 1 CAPSULE(200 MG) BY MOUTH DAILY, Disp: 90 capsule, Rfl: 0 .  fluocinonide (LIDEX) 0.05 % external solution, Apply 1 mL topically 2 (two) times daily., Disp: , Rfl: 2 .  labetalol (NORMODYNE) 200 MG tablet, TAKE 1 TABLET(200 MG) BY MOUTH TWICE DAILY, Disp: 180 tablet, Rfl: 3 .  levothyroxine (SYNTHROID) 75 MCG tablet, TAKE 1 TABLET(75 MCG) BY MOUTH DAILY, Disp: 90 tablet, Rfl: 0 .  losartan (COZAAR) 100 MG tablet, TAKE 1 TABLET(100 MG) BY MOUTH DAILY, Disp: 90 tablet, Rfl: 0 .  pantoprazole (PROTONIX) 40 MG tablet, TAKE 1 TABLET(40 MG) BY MOUTH DAILY, Disp: 30 tablet, Rfl: 3 .  fexofenadine (ALLEGRA) 180 MG tablet, Take 1 tablet (180 mg total) by mouth daily., Disp: 90 tablet, Rfl: 1 .  fluticasone (FLONASE) 50 MCG/ACT nasal spray, Place into both nostrils daily., Disp: , Rfl:   EXAM:  VITALS per patient if applicable: None.  GENERAL: alert, oriented, appears well and in no acute distress  HEENT: atraumatic, conjunttiva clear, no obvious abnormalities on inspection of external nose and ears  NECK: normal movements of the head and neck  LUNGS: on inspection no signs of respiratory distress, breathing rate appears normal, no obvious gross SOB, gasping or wheezing  CV: no obvious cyanosis  MS: moves all visible extremities without noticeable abnormality  PSYCH/NEURO: pleasant and cooperative, no obvious depression or anxiety, speech and thought processing grossly intact  ASSESSMENT AND PLAN:  Discussed the following assessment and plan:  Hilar lymphadenopathy Negative work-up thus far.  She will continue to have follow-up scans through pulmonology.  Essential hypertension Well-controlled.  Continue current regimen.  DOE (dyspnea on exertion) She has undergone work-up through cardiology and pulmonology for this issue with no cause found at this time.  Suspect weight related.  She will work on  increasing activity and losing weight.  Obesity Patient is diet seems to be fairly good with the exception of drinking 2 glasses of wine daily.  I have asked her to decrease that to 1 glass of wine and consider decreasing further for caloric benefit.  I encouraged her to add walking in for exercise to start with.  Diet recommendations to be sent to the patient through the after visit summary.  Hypothyroidism Continue Synthroid.  Check lab work in 1 month.    I discussed the assessment and treatment plan with the patient. The patient was provided an opportunity to ask questions and all were answered. The patient agreed with the plan and demonstrated an understanding of the instructions.   The patient was advised to call back or seek an in-person evaluation if the symptoms worsen or if the condition fails to improve as anticipated.   I provided 32 minutes of non-face-to-face time during this visit.  Marikay AlarEric Kamyah Wilhelmsen, MD

## 2019-03-02 ENCOUNTER — Encounter: Payer: Self-pay | Admitting: Family Medicine

## 2019-03-02 NOTE — Assessment & Plan Note (Signed)
Continue Synthroid.  Check lab work in 1 month.

## 2019-03-02 NOTE — Assessment & Plan Note (Signed)
Well-controlled.  Continue current regimen. 

## 2019-03-02 NOTE — Assessment & Plan Note (Addendum)
Patient is diet seems to be fairly good with the exception of drinking 2 glasses of wine daily.  I have asked her to decrease that to 1 glass of wine and consider decreasing further for caloric benefit.  I encouraged her to add walking in for exercise to start with.  Diet recommendations to be sent to the patient through the after visit summary.

## 2019-03-02 NOTE — Assessment & Plan Note (Signed)
She has undergone work-up through cardiology and pulmonology for this issue with no cause found at this time.  Suspect weight related.  She will work on increasing activity and losing weight.

## 2019-03-02 NOTE — Assessment & Plan Note (Signed)
Negative work-up thus far.  She will continue to have follow-up scans through pulmonology.

## 2019-03-02 NOTE — Patient Instructions (Signed)
Diet Recommendations  Starchy (carb) foods: Bread, rice, pasta, potatoes, corn, cereal, grits, crackers, bagels, muffins, all baked goods.  (Fruits, milk, and yogurt also have carbohydrate, but most of these foods will not spike your blood sugar as the starchy foods will.)  A few fruits do cause high blood sugars; use small portions of bananas (limit to 1/2 at a time), grapes, watermelon, oranges, and most tropical fruits.    Protein foods: Meat, fish, poultry, eggs, dairy foods, and beans such as pinto and kidney beans (beans also provide carbohydrate).   1. Eat at least 3 meals and 1-2 snacks per day. Never go more than 4-5 hours while awake without eating. Eat breakfast within the first hour of getting up.   2. Limit starchy foods to TWO per meal and ONE per snack. ONE portion of a starchy  food is equal to the following:   - ONE slice of bread (or its equivalent, such as half of a hamburger bun).   - 1/2 cup of a "scoopable" starchy food such as potatoes or rice.   - 15 grams of carbohydrate as shown on food label.  3. Include at every meal: a protein food, a carb food, and vegetables and/or fruit.   - Obtain twice the volume of veg's as protein or carbohydrate foods for both lunch and dinner.   - Fresh or frozen veg's are best.   - Keep frozen veg's on hand for a quick vegetable serving.    

## 2019-03-26 ENCOUNTER — Other Ambulatory Visit: Payer: Self-pay | Admitting: Internal Medicine

## 2019-03-26 DIAGNOSIS — K219 Gastro-esophageal reflux disease without esophagitis: Secondary | ICD-10-CM

## 2019-03-26 MED ORDER — PANTOPRAZOLE SODIUM 40 MG PO TBEC: DELAYED_RELEASE_TABLET | ORAL | 3 refills | Status: DC

## 2019-03-31 ENCOUNTER — Other Ambulatory Visit: Payer: Self-pay | Admitting: Family Medicine

## 2019-04-30 ENCOUNTER — Other Ambulatory Visit: Payer: Self-pay

## 2019-04-30 ENCOUNTER — Other Ambulatory Visit: Payer: Self-pay | Admitting: Family Medicine

## 2019-04-30 DIAGNOSIS — Z20822 Contact with and (suspected) exposure to covid-19: Secondary | ICD-10-CM

## 2019-05-01 LAB — NOVEL CORONAVIRUS, NAA: SARS-CoV-2, NAA: NOT DETECTED

## 2019-05-23 ENCOUNTER — Other Ambulatory Visit: Payer: Self-pay | Admitting: Family Medicine

## 2019-05-26 NOTE — Telephone Encounter (Signed)
Please call this patient.  She has lab orders that need to be completed to receive a refill of the Celebrex.  Please get her scheduled for those labs and once they are completed I will refill her Celebrex.

## 2019-05-27 ENCOUNTER — Other Ambulatory Visit: Payer: Self-pay

## 2019-05-27 ENCOUNTER — Other Ambulatory Visit (INDEPENDENT_AMBULATORY_CARE_PROVIDER_SITE_OTHER): Payer: BC Managed Care – PPO

## 2019-05-27 DIAGNOSIS — E039 Hypothyroidism, unspecified: Secondary | ICD-10-CM

## 2019-05-27 DIAGNOSIS — I10 Essential (primary) hypertension: Secondary | ICD-10-CM | POA: Diagnosis not present

## 2019-05-27 NOTE — Telephone Encounter (Signed)
Called and spoke with patient and lab was scheduled for today.  Nina,cma

## 2019-05-28 ENCOUNTER — Encounter: Payer: Self-pay | Admitting: Family Medicine

## 2019-05-28 ENCOUNTER — Other Ambulatory Visit: Payer: Self-pay | Admitting: Family Medicine

## 2019-05-28 DIAGNOSIS — R945 Abnormal results of liver function studies: Secondary | ICD-10-CM

## 2019-05-28 DIAGNOSIS — R7989 Other specified abnormal findings of blood chemistry: Secondary | ICD-10-CM

## 2019-05-28 LAB — COMPREHENSIVE METABOLIC PANEL
ALT: 58 U/L — ABNORMAL HIGH (ref 0–35)
AST: 40 U/L — ABNORMAL HIGH (ref 0–37)
Albumin: 4.3 g/dL (ref 3.5–5.2)
Alkaline Phosphatase: 171 U/L — ABNORMAL HIGH (ref 39–117)
BUN: 17 mg/dL (ref 6–23)
CO2: 29 mEq/L (ref 19–32)
Calcium: 9.7 mg/dL (ref 8.4–10.5)
Chloride: 105 mEq/L (ref 96–112)
Creatinine, Ser: 0.98 mg/dL (ref 0.40–1.20)
GFR: 59.06 mL/min — ABNORMAL LOW (ref >60.00)
Glucose, Bld: 107 mg/dL — ABNORMAL HIGH (ref 70–99)
Potassium: 4.1 mEq/L (ref 3.5–5.1)
Sodium: 141 mEq/L (ref 135–145)
Total Bilirubin: 0.6 mg/dL (ref 0.2–1.2)
Total Protein: 6.6 g/dL (ref 6.0–8.3)

## 2019-05-28 LAB — TSH: TSH: 2.15 u[IU]/mL (ref 0.35–4.50)

## 2019-05-28 NOTE — Telephone Encounter (Signed)
See result not for lab work.

## 2019-05-30 ENCOUNTER — Other Ambulatory Visit: Payer: Self-pay | Admitting: Family Medicine

## 2019-06-02 ENCOUNTER — Encounter: Payer: Self-pay | Admitting: Family Medicine

## 2019-06-02 MED ORDER — MELOXICAM 7.5 MG PO TABS: 7.5000 mg | ORAL_TABLET | Freq: Every day | ORAL | 0 refills | Status: DC

## 2019-06-03 ENCOUNTER — Other Ambulatory Visit: Payer: Self-pay

## 2019-06-03 ENCOUNTER — Other Ambulatory Visit (INDEPENDENT_AMBULATORY_CARE_PROVIDER_SITE_OTHER): Payer: BC Managed Care – PPO

## 2019-06-03 DIAGNOSIS — R945 Abnormal results of liver function studies: Secondary | ICD-10-CM

## 2019-06-03 DIAGNOSIS — Z23 Encounter for immunization: Secondary | ICD-10-CM

## 2019-06-03 DIAGNOSIS — R7989 Other specified abnormal findings of blood chemistry: Secondary | ICD-10-CM

## 2019-06-03 LAB — HEPATIC FUNCTION PANEL
ALT: 47 U/L — ABNORMAL HIGH (ref 0–35)
AST: 30 U/L (ref 0–37)
Albumin: 4.2 g/dL (ref 3.5–5.2)
Alkaline Phosphatase: 155 U/L — ABNORMAL HIGH (ref 39–117)
Bilirubin, Direct: 0.1 mg/dL (ref 0.0–0.3)
Total Bilirubin: 0.6 mg/dL (ref 0.2–1.2)
Total Protein: 6.5 g/dL (ref 6.0–8.3)

## 2019-06-06 ENCOUNTER — Other Ambulatory Visit: Payer: Self-pay | Admitting: Family Medicine

## 2019-06-06 DIAGNOSIS — R945 Abnormal results of liver function studies: Secondary | ICD-10-CM

## 2019-06-06 DIAGNOSIS — R7989 Other specified abnormal findings of blood chemistry: Secondary | ICD-10-CM

## 2019-06-17 ENCOUNTER — Other Ambulatory Visit: Payer: Self-pay

## 2019-06-17 ENCOUNTER — Other Ambulatory Visit (INDEPENDENT_AMBULATORY_CARE_PROVIDER_SITE_OTHER): Payer: BC Managed Care – PPO

## 2019-06-17 DIAGNOSIS — R7989 Other specified abnormal findings of blood chemistry: Secondary | ICD-10-CM

## 2019-06-17 DIAGNOSIS — R945 Abnormal results of liver function studies: Secondary | ICD-10-CM | POA: Diagnosis not present

## 2019-06-17 LAB — HEPATIC FUNCTION PANEL
ALT: 42 U/L — ABNORMAL HIGH (ref 0–35)
AST: 34 U/L (ref 0–37)
Albumin: 4.3 g/dL (ref 3.5–5.2)
Alkaline Phosphatase: 157 U/L — ABNORMAL HIGH (ref 39–117)
Bilirubin, Direct: 0.1 mg/dL (ref 0.0–0.3)
Total Bilirubin: 0.7 mg/dL (ref 0.2–1.2)
Total Protein: 6.6 g/dL (ref 6.0–8.3)

## 2019-06-22 ENCOUNTER — Telehealth: Payer: Self-pay

## 2019-06-22 ENCOUNTER — Encounter: Payer: Self-pay | Admitting: Family Medicine

## 2019-06-22 MED ORDER — MELOXICAM 7.5 MG PO TABS: 7.5000 mg | ORAL_TABLET | Freq: Every day | ORAL | 0 refills | Status: DC

## 2019-06-22 NOTE — Telephone Encounter (Signed)
Called and spoke with  patient per the provider and scheduled her for a follow up in December.  Nautika Cressey,cma

## 2019-06-29 ENCOUNTER — Other Ambulatory Visit: Payer: Self-pay | Admitting: Family Medicine

## 2019-07-07 DIAGNOSIS — G4733 Obstructive sleep apnea (adult) (pediatric): Secondary | ICD-10-CM | POA: Diagnosis not present

## 2019-07-16 ENCOUNTER — Telehealth: Payer: Self-pay | Admitting: Internal Medicine

## 2019-07-16 NOTE — Telephone Encounter (Signed)
Ok please let patient know insurance company has denied

## 2019-07-16 NOTE — Telephone Encounter (Signed)
Called and spoke with patient and she is aware that Insurance Denied CT Chest submitted for November.  Advised patient that she would be informed by [employer] as to why.  F/U appointment has been scheduled and physician and patient can re-visit the CT Chest at the time of OV in November.   Pt is aware. Rhonda J Cobb

## 2019-07-16 NOTE — Telephone Encounter (Signed)
[employer] denied CT Chest without contrast due in Nov 2020 stating that CT is not Medically Necessary.    If you wish to do a peer to peer you can do so by calling [phone].  Please advise. Rhonda J Cobb

## 2019-07-24 ENCOUNTER — Telehealth: Payer: Self-pay

## 2019-07-24 DIAGNOSIS — Z5181 Encounter for therapeutic drug level monitoring: Secondary | ICD-10-CM

## 2019-07-24 MED ORDER — MELOXICAM 7.5 MG PO TABS: 7.5000 mg | ORAL_TABLET | Freq: Every day | ORAL | 0 refills | Status: DC

## 2019-07-24 NOTE — Telephone Encounter (Signed)
I called and left a message informing the patient to call the office to schedule labs and that her medication was sen tto the pharmacy.  Merdith Adan,cma

## 2019-07-24 NOTE — Telephone Encounter (Signed)
Refill sent to the pharmacy. She needs to have her kidney function checked with being on this daily. I have ordered this. Can you get her scheduled? Thanks.

## 2019-08-04 ENCOUNTER — Other Ambulatory Visit: Payer: Self-pay

## 2019-08-04 DIAGNOSIS — K219 Gastro-esophageal reflux disease without esophagitis: Secondary | ICD-10-CM

## 2019-08-04 MED ORDER — PANTOPRAZOLE SODIUM 40 MG PO TBEC: DELAYED_RELEASE_TABLET | ORAL | 3 refills | Status: DC

## 2019-08-14 ENCOUNTER — Encounter: Payer: Self-pay | Admitting: Internal Medicine

## 2019-08-14 ENCOUNTER — Ambulatory Visit (INDEPENDENT_AMBULATORY_CARE_PROVIDER_SITE_OTHER): Payer: BC Managed Care – PPO | Admitting: Internal Medicine

## 2019-08-14 ENCOUNTER — Other Ambulatory Visit: Payer: Self-pay

## 2019-08-14 ENCOUNTER — Other Ambulatory Visit (INDEPENDENT_AMBULATORY_CARE_PROVIDER_SITE_OTHER): Payer: BC Managed Care – PPO

## 2019-08-14 DIAGNOSIS — K219 Gastro-esophageal reflux disease without esophagitis: Secondary | ICD-10-CM

## 2019-08-14 DIAGNOSIS — R599 Enlarged lymph nodes, unspecified: Secondary | ICD-10-CM

## 2019-08-14 DIAGNOSIS — Z5181 Encounter for therapeutic drug level monitoring: Secondary | ICD-10-CM | POA: Diagnosis not present

## 2019-08-14 DIAGNOSIS — R591 Generalized enlarged lymph nodes: Secondary | ICD-10-CM

## 2019-08-14 LAB — BASIC METABOLIC PANEL
BUN: 18 mg/dL (ref 6–23)
CO2: 30 mEq/L (ref 19–32)
Calcium: 9.8 mg/dL (ref 8.4–10.5)
Chloride: 101 mEq/L (ref 96–112)
Creatinine, Ser: 1.04 mg/dL (ref 0.40–1.20)
GFR: 55.11 mL/min — ABNORMAL LOW (ref >60.00)
Glucose, Bld: 82 mg/dL (ref 70–99)
Potassium: 4.1 mEq/L (ref 3.5–5.1)
Sodium: 140 mEq/L (ref 135–145)

## 2019-08-14 MED ORDER — PANTOPRAZOLE SODIUM 40 MG PO TBEC: DELAYED_RELEASE_TABLET | ORAL | 3 refills | Status: DC

## 2019-08-14 NOTE — Patient Instructions (Signed)
CT chest with IV contrast Continue CPAP as prescribed  COVID-19 COVID-19 is a respiratory infection that is caused by a virus called severe acute respiratory syndrome coronavirus 2 (SARS-CoV-2). The disease is also known as coronavirus disease or novel coronavirus. In some people, the virus may not cause any symptoms. In others, it may cause a serious infection. The infection can get worse quickly and can lead to complications, such as:  Pneumonia, or infection of the lungs.  Acute respiratory distress syndrome or ARDS. This is fluid build-up in the lungs.  Acute respiratory failure. This is a condition in which there is not enough oxygen passing from the lungs to the body.  Sepsis or septic shock. This is a serious bodily reaction to an infection.  Blood clotting problems.  Secondary infections due to bacteria or fungus. The virus that causes COVID-19 is contagious. This means that it can spread from person to person through droplets from coughs and sneezes (respiratory secretions). What are the causes? This illness is caused by a virus. You may catch the virus by:  Breathing in droplets from an infected person's cough or sneeze.  Touching something, like a table or a doorknob, that was exposed to the virus (contaminated) and then touching your mouth, nose, or eyes. What increases the risk? Risk for infection You are more likely to be infected with this virus if you:  Live in or travel to an area with a COVID-19 outbreak.  Come in contact with a sick person who recently traveled to an area with a COVID-19 outbreak.  Provide care for or live with a person who is infected with COVID-19. Risk for serious illness You are more likely to become seriously ill from the virus if you:  Are 86 years of age or older.  Have a long-term disease that lowers your body's ability to fight infection (immunocompromised).  Live in a nursing home or long-term care facility.  Have a long-term  (chronic) disease such as: ? Chronic lung disease, including chronic obstructive pulmonary disease or asthma ? Heart disease. ? Diabetes. ? Chronic kidney disease. ? Liver disease.  Are obese. What are the signs or symptoms? Symptoms of this condition can range from mild to severe. Symptoms may appear any time from 2 to 14 days after being exposed to the virus. They include:  A fever.  A cough.  Difficulty breathing.  Chills.  Muscle pains.  A sore throat.  Loss of taste or smell. Some people may also have stomach problems, such as nausea, vomiting, or diarrhea. Other people may not have any symptoms of COVID-19. How is this diagnosed? This condition may be diagnosed based on:  Your signs and symptoms, especially if: ? You live in an area with a COVID-19 outbreak. ? You recently traveled to or from an area where the virus is common. ? You provide care for or live with a person who was diagnosed with COVID-19.  A physical exam.  Lab tests, which may include: ? A nasal swab to take a sample of fluid from your nose. ? A throat swab to take a sample of fluid from your throat. ? A sample of mucus from your lungs (sputum). ? Blood tests.  Imaging tests, which may include, X-rays, CT scan, or ultrasound. How is this treated? At present, there is no medicine to treat COVID-19. Medicines that treat other diseases are being used on a trial basis to see if they are effective against COVID-19. Your health care provider will talk  with you about ways to treat your symptoms. For most people, the infection is mild and can be managed at home with rest, fluids, and over-the-counter medicines. Treatment for a serious infection usually takes places in a hospital intensive care unit (ICU). It may include one or more of the following treatments. These treatments are given until your symptoms improve.  Receiving fluids and medicines through an IV.  Supplemental oxygen. Extra oxygen is  given through a tube in the nose, a face mask, or a hood.  Positioning you to lie on your stomach (prone position). This makes it easier for oxygen to get into the lungs.  Continuous positive airway pressure (CPAP) or bi-level positive airway pressure (BPAP) machine. This treatment uses mild air pressure to keep the airways open. A tube that is connected to a motor delivers oxygen to the body.  Ventilator. This treatment moves air into and out of the lungs by using a tube that is placed in your windpipe.  Tracheostomy. This is a procedure to create a hole in the neck so that a breathing tube can be inserted.  Extracorporeal membrane oxygenation (ECMO). This procedure gives the lungs a chance to recover by taking over the functions of the heart and lungs. It supplies oxygen to the body and removes carbon dioxide. Follow these instructions at home: Lifestyle  If you are sick, stay home except to get medical care. Your health care provider will tell you how long to stay home. Call your health care provider before you go for medical care.  Rest at home as told by your health care provider.  Do not use any products that contain nicotine or tobacco, such as cigarettes, e-cigarettes, and chewing tobacco. If you need help quitting, ask your health care provider.  Return to your normal activities as told by your health care provider. Ask your health care provider what activities are safe for you. General instructions  Take over-the-counter and prescription medicines only as told by your health care provider.  Drink enough fluid to keep your urine pale yellow.  Keep all follow-up visits as told by your health care provider. This is important. How is this prevented?  There is no vaccine to help prevent COVID-19 infection. However, there are steps you can take to protect yourself and others from this virus. To protect yourself:   Do not travel to areas where COVID-19 is a risk. The areas where  COVID-19 is reported change often. To identify high-risk areas and travel restrictions, check the CDC travel website: StageSync.siwwwnc.cdc.gov/travel/notices  If you live in, or must travel to, an area where COVID-19 is a risk, take precautions to avoid infection. ? Stay away from people who are sick. ? Wash your hands often with soap and water for 20 seconds. If soap and water are not available, use an alcohol-based hand sanitizer. ? Avoid touching your mouth, face, eyes, or nose. ? Avoid going out in public, follow guidance from your state and local health authorities. ? If you must go out in public, wear a cloth face covering or face mask. ? Disinfect objects and surfaces that are frequently touched every day. This may include:  Counters and tables.  Doorknobs and light switches.  Sinks and faucets.  Electronics, such as phones, remote controls, keyboards, computers, and tablets. To protect others: If you have symptoms of COVID-19, take steps to prevent the virus from spreading to others.  If you think you have a COVID-19 infection, contact your health care provider right away.  Tell your health care team that you think you may have a COVID-19 infection.  Stay home. Leave your house only to seek medical care. Do not use public transport.  Do not travel while you are sick.  Wash your hands often with soap and water for 20 seconds. If soap and water are not available, use alcohol-based hand sanitizer.  Stay away from other members of your household. Let healthy household members care for children and pets, if possible. If you have to care for children or pets, wash your hands often and wear a mask. If possible, stay in your own room, separate from others. Use a different bathroom.  Make sure that all people in your household wash their hands well and often.  Cough or sneeze into a tissue or your sleeve or elbow. Do not cough or sneeze into your hand or into the air.  Wear a cloth face covering  or face mask. Where to find more information  Centers for Disease Control and Prevention: PurpleGadgets.be  World Health Organization: https://www.castaneda.info/ Contact a health care provider if:  You live in or have traveled to an area where COVID-19 is a risk and you have symptoms of the infection.  You have had contact with someone who has COVID-19 and you have symptoms of the infection. Get help right away if:  You have trouble breathing.  You have pain or pressure in your chest.  You have confusion.  You have bluish lips and fingernails.  You have difficulty waking from sleep.  You have symptoms that get worse. These symptoms may represent a serious problem that is an emergency. Do not wait to see if the symptoms will go away. Get medical help right away. Call your local emergency services (911 in the [place] Do not drive yourself to the hospital. Let the emergency medical personnel know if you think you have COVID-19. Summary  COVID-19 is a respiratory infection that is caused by a virus. It is also known as coronavirus disease or novel coronavirus. It can cause serious infections, such as pneumonia, acute respiratory distress syndrome, acute respiratory failure, or sepsis.  The virus that causes COVID-19 is contagious. This means that it can spread from person to person through droplets from coughs and sneezes.  You are more likely to develop a serious illness if you are 86 years of age or older, have a weak immunity, live in a nursing home, or have chronic disease.  There is no medicine to treat COVID-19. Your health care provider will talk with you about ways to treat your symptoms.  Take steps to protect yourself and others from infection. Wash your hands often and disinfect objects and surfaces that are frequently touched every day. Stay away from people who are sick and wear a mask if you are sick. This information is not intended  to replace advice given to you by your health care provider. Make sure you discuss any questions you have with your health care provider. Document Released: 10/02/2018 Document Revised: 01/22/2019 Document Reviewed: 10/02/2018 Elsevier Patient Education  2020 [employer].

## 2019-08-14 NOTE — Telephone Encounter (Signed)
Patient passed screening & scheduled today 12/4 to come in for BMP.

## 2019-08-14 NOTE — Progress Notes (Signed)
Name: Annette Valencia MRN: [id number] DOB: November 07, 1965     CONSULTATION DATE: 8.28.19 REFERRING MD : Caryl Bis     STUDIES:   8.22.19   CT chest Independently reviewed by Me today B/l adenopathy Paratracheal adenopathy Interpretation-possible sarcoidosis  08/04/2018 CT chest independently reviewed by me today Bilateral adenopathy Right paratracheal adenopathy stable over last 3 months No pneumonia no effusions no infiltrates Interpretation possible sarcoid  05/29/2018 pulmonary function test Ratio 90% predicted FEV1 96% predicted TLC is 88% predicted Interpretation normal spirometry normal lung volumes   CHIEF COMPLAINT:  follow up abnormal CT chest Patient has a history of bilateral adenopathy  HISTORY OF PRESENT ILLNESS: At this time patient has progressive shortness of breath and chest heaviness when she is lying down Patient will need CT chest for further evaluation of her adenopathy It has been over 1 year since she has some type of assessment Patient now has symptoms of chest tightness and heaviness over the last several months  Patient does not have any signs of infection at this time  Patient with OSA Currently on nasal mask CPAP At this time I do not have a compliance report however she does really well and benefits from therapy She is very satisfied with her therapy  Patient does have reflux and is controlled by Protonix however I recommend seeing a GI doctor for further assessment Former smoker 1 pack a day for 10 years Extensive secondhand smoke exposure as a child  No evidence of heart failure at this time      Prior to Admission medications   Medication Sig Start Date End Date Taking? Authorizing Provider  atorvastatin (LIPITOR) 40 MG tablet Take 40 mg by mouth daily. 04/14/18 04/14/19 Yes [provider]  celecoxib (CELEBREX) 200 MG capsule TAKE 1 CAPSULE(200 MG) BY MOUTH DAILY 12/31/17  Yes Leone Haven, MD  fexofenadine  (ALLEGRA) 180 MG tablet Take 1 tablet (180 mg total) by mouth daily. 06/19/16  Yes Cook, Jayce G, DO  fluticasone (FLONASE) 50 MCG/ACT nasal spray Place into both nostrils daily.   Yes [provider]  labetalol (NORMODYNE) 200 MG tablet Take 1 tablet (200 mg total) by mouth 2 (two) times daily. Patient taking differently: Take 100 mg by mouth 2 (two) times daily.  10/08/17  Yes Leone Haven, MD  levothyroxine (SYNTHROID, LEVOTHROID) 75 MCG tablet TAKE 1 TABLET(75 MCG) BY MOUTH DAILY 04/08/18  Yes Leone Haven, MD  losartan (COZAAR) 100 MG tablet TAKE 1 TABLET(100 MG) BY MOUTH DAILY Patient taking differently: 50 mg 2 (two) times daily.  12/03/17  Yes Leone Haven, MD  nitroGLYCERIN (NITROSTAT) 0.4 MG SL tablet DISSOLVE 1 TAB UNDER TONGUE EVERY 5 MINS TO MAX OF 3 DOSES AS NEEDED FOR CHEST PAIN 04/14/18 04/14/19 Yes [provider]   No Known Allergies  FAMILY HISTORY:  family history includes ALS in her father; Alcohol abuse in her father; Arthritis in her mother; Cancer in her mother; Hyperlipidemia in her mother; Hypertension in her father and maternal grandmother; Stroke in her father. SOCIAL HISTORY:  reports that she quit smoking about 27 years ago. She has never used smokeless tobacco. She reports current alcohol use of about 10.0 standard drinks of alcohol per week. She reports that she does not use drugs.     Review of Systems:  Gen:  Denies  fever, sweats, chills weight loss  HEENT: Denies blurred vision, double vision, ear pain, eye pain, hearing loss, nose bleeds, sore throat Cardiac:  No  dizziness, chest pain or heaviness, chest tightness,edema, No JVD Resp:   No cough, -sputum production, -shortness of breath,-wheezing, -hemoptysis,  Gi: -vomiting, diarrhea, constipation, bowel incontinence +GERD Gu:  Denies bladder incontinence, burning urine Ext:   Denies Joint pain, stiffness or swelling Skin: Denies  skin rash, easy bruising or bleeding or  hives Endoc:  Denies polyuria, polydipsia , polyphagia or weight change Psych:   Denies depression, insomnia or hallucinations  Other:  All other systems negative       ASSESSMENT / PLAN: 53 year old pleasant white female seen today for follow-up abnormal CT scan with bilateral hilar adenopathy and right paratracheal adenopathy  -At this time patient does have significant symptoms of chest heaviness and chest tightness with some progressive shortness of breath over the last several months   I believe CT of the chest is warranted at this time with IV contrast to assess adenopathy and progression of disease   Possibilities of history of abnormal  adenopathy would include sarcoidosis/lymphoma  Shortness of breath progressive over the last months with chest heaviness History of adenopathy I strongly recommend CT chest with contrast for further assessment    OSA Continue nasal CPAP as prescribed Patient is benefiting and tolerating well from CPAP therapy Compliance report will be needed at next office visit   Obesity -recommend significant weight loss -recommend changing diet  Deconditioned state -Recommend increased daily activity and exercise  GERD Continue PPI I do recommend GI evaluation at some point for endoscopy   COVID-19 EDUCATION: The signs and symptoms of COVID-19 were discussed with the patient and how to seek care for testing.  The importance of social distancing was discussed today. Hand Washing Techniques and avoid touching face was advised.     MEDICATION ADJUSTMENTS/LABS AND TESTS ORDERED: CT chest with IV contrast Continue CPAP as prescribed   CURRENT MEDICATIONS REVIEWED AT LENGTH WITH PATIENT TODAY   Patient satisfied with Plan of action and management. All questions answered  Follow up in 6 months   Edom Schmuhl Santiago Glad, M.D.  [facility]  Medical Director [facility] Medical Director [facility]  Cardio-Pulmonary Department

## 2019-08-17 ENCOUNTER — Other Ambulatory Visit: Payer: Self-pay

## 2019-08-17 DIAGNOSIS — R944 Abnormal results of kidney function studies: Secondary | ICD-10-CM

## 2019-08-26 ENCOUNTER — Other Ambulatory Visit: Payer: Self-pay | Admitting: Family Medicine

## 2019-08-28 ENCOUNTER — Other Ambulatory Visit: Payer: Self-pay | Admitting: Family Medicine

## 2019-08-31 DIAGNOSIS — M25651 Stiffness of right hip, not elsewhere classified: Secondary | ICD-10-CM | POA: Diagnosis not present

## 2019-08-31 DIAGNOSIS — M791 Myalgia, unspecified site: Secondary | ICD-10-CM | POA: Diagnosis not present

## 2019-08-31 DIAGNOSIS — M545 Low back pain: Secondary | ICD-10-CM | POA: Diagnosis not present

## 2019-08-31 DIAGNOSIS — M9903 Segmental and somatic dysfunction of lumbar region: Secondary | ICD-10-CM | POA: Diagnosis not present

## 2019-09-01 ENCOUNTER — Other Ambulatory Visit (INDEPENDENT_AMBULATORY_CARE_PROVIDER_SITE_OTHER): Payer: BC Managed Care – PPO

## 2019-09-01 ENCOUNTER — Encounter: Payer: Self-pay | Admitting: Family Medicine

## 2019-09-01 ENCOUNTER — Other Ambulatory Visit: Payer: Self-pay

## 2019-09-01 ENCOUNTER — Ambulatory Visit (INDEPENDENT_AMBULATORY_CARE_PROVIDER_SITE_OTHER): Payer: BC Managed Care – PPO | Admitting: Family Medicine

## 2019-09-01 VITALS — Ht 67.0 in | Wt 268.0 lb

## 2019-09-01 DIAGNOSIS — K219 Gastro-esophageal reflux disease without esophagitis: Secondary | ICD-10-CM | POA: Diagnosis not present

## 2019-09-01 DIAGNOSIS — R600 Localized edema: Secondary | ICD-10-CM

## 2019-09-01 DIAGNOSIS — I10 Essential (primary) hypertension: Secondary | ICD-10-CM | POA: Diagnosis not present

## 2019-09-01 DIAGNOSIS — M170 Bilateral primary osteoarthritis of knee: Secondary | ICD-10-CM

## 2019-09-01 DIAGNOSIS — Z6839 Body mass index (BMI) 39.0-39.9, adult: Secondary | ICD-10-CM

## 2019-09-01 LAB — COMPREHENSIVE METABOLIC PANEL
ALT: 48 U/L — ABNORMAL HIGH (ref 0–35)
AST: 34 U/L (ref 0–37)
Albumin: 4.3 g/dL (ref 3.5–5.2)
Alkaline Phosphatase: 140 U/L — ABNORMAL HIGH (ref 39–117)
BUN: 18 mg/dL (ref 6–23)
CO2: 28 mEq/L (ref 19–32)
Calcium: 9.8 mg/dL (ref 8.4–10.5)
Chloride: 103 mEq/L (ref 96–112)
Creatinine, Ser: 0.97 mg/dL (ref 0.40–1.20)
GFR: 59.71 mL/min — ABNORMAL LOW (ref >60.00)
Glucose, Bld: 104 mg/dL — ABNORMAL HIGH (ref 70–99)
Potassium: 4.3 mEq/L (ref 3.5–5.1)
Sodium: 139 mEq/L (ref 135–145)
Total Bilirubin: 0.7 mg/dL (ref 0.2–1.2)
Total Protein: 6.6 g/dL (ref 6.0–8.3)

## 2019-09-01 LAB — CBC
HCT: 38.4 % (ref 36.0–46.0)
Hemoglobin: 12.5 g/dL (ref 12.0–15.0)
MCHC: 32.7 g/dL (ref 30.0–36.0)
MCV: 88.8 fl (ref 78.0–100.0)
Platelets: 206 10*3/uL (ref 150.0–400.0)
RBC: 4.33 Mil/uL (ref 3.87–5.11)
RDW: 14 % (ref 11.5–15.5)
WBC: 6.6 10*3/uL (ref 4.0–10.5)

## 2019-09-01 MED ORDER — LABETALOL HCL 100 MG PO TABS: 100.0000 mg | ORAL_TABLET | Freq: Two times a day (BID) | ORAL | 1 refills | Status: DC

## 2019-09-01 NOTE — Progress Notes (Signed)
Virtual Visit via telephone Note  This visit type was conducted due to national recommendations for restrictions regarding the COVID-19 pandemic (e.g. social distancing).  This format is felt to be most appropriate for this patient at this time.  All issues noted in this document were discussed and addressed.  No physical exam was performed (except for noted visual exam findings with Video Visits).   I connected with Annette Valencia today at  9:00 AM EST by telephone and verified that I am speaking with the correct person using two identifiers. Location patient: home Location provider: work  Persons participating in the virtual visit: patient, provider  I discussed the limitations, risks, security and privacy concerns of performing an evaluation and management service by telephone and the availability of in person appointments. I also discussed with the patient that there may be a patient responsible charge related to this service. The patient expressed understanding and agreed to proceed.  Interactive audio and video telecommunications were attempted between this provider and patient, however failed, due to patient having technical difficulties OR patient did not have access to video capability.  We continued and completed visit with audio only.  Reason for visit: follow-up  HPI: GERD: Taking Protonix.  No reflux symptoms with medication.  No abdominal pain, blood in her stool, or dysphagia.  Hypertension: She does not have specific numbers to report though she reports her blood pressures been fine.  Taking losartan.  She is taking labetalol half a tablet twice daily.  No chest pain.  She has chronic dyspnea which is being evaluated by her specialist.  She feels as though she may be retains fluid when she is traveling though not at other times.  No orthopnea or PND.  Chronic back and knee pain: Meloxicam does help some.  Its not as good as the Celebrex.  Her GFR trended down slightly with  this.  She due for recheck.  Obesity: She has been stretching and doing some walking for exercise.  Diet is pretty healthy with yogurt and granola for breakfast, soup, salad, sandwich for dinner.  Dinner is nothing too bad.   ROS: See pertinent positives and negatives per HPI.  Past Medical History:  Diagnosis Date  . Arthritis    knees  . Chicken pox   . Frequent headaches   . Hyperlipidemia   . Hypertension   . Hypothyroidism   . Migraines   . PONV (postoperative nausea and vomiting)   . Scoliosis   . Sleep apnea    CPAP  . UTI (urinary tract infection)     Past Surgical History:  Procedure Laterality Date  . CERVICAL ABLATION  2017  . CESAREAN SECTION  1988  . CHOLECYSTECTOMY    . COLONOSCOPY WITH PROPOFOL N/A 08/06/2017   Procedure: COLONOSCOPY WITH PROPOFOL;  Surgeon: Virgel Manifold, MD;  Location: [place];  Service: Endoscopy;  Laterality: N/A;  please keep early  . tubes tied  2000    Family History  Problem Relation Age of Onset  . Arthritis Mother   . Cancer Mother        lung  . Hyperlipidemia Mother   . Alcohol abuse Father   . Stroke Father   . Hypertension Father   . ALS Father   . Hypertension Maternal Grandmother     SOCIAL HX: Former smoker.   Current Outpatient Medications:  .  atorvastatin (LIPITOR) 40 MG tablet, Take 1 tablet (40 mg total) by mouth daily., Disp: 90 tablet, Rfl:  3 .  fexofenadine (ALLEGRA) 180 MG tablet, Take 1 tablet (180 mg total) by mouth daily., Disp: 90 tablet, Rfl: 1 .  fluocinonide (LIDEX) 0.05 % external solution, Apply 1 mL topically 2 (two) times daily., Disp: , Rfl: 2 .  fluticasone (FLONASE) 50 MCG/ACT nasal spray, Place into both nostrils daily., Disp: , Rfl:  .  labetalol (NORMODYNE) 100 MG tablet, Take 1 tablet (100 mg total) by mouth 2 (two) times daily., Disp: 180 tablet, Rfl: 1 .  levothyroxine (SYNTHROID) 75 MCG tablet, TAKE 1 TABLET(75 MCG) BY MOUTH DAILY, Disp: 90 tablet, Rfl: 3 .   losartan (COZAAR) 100 MG tablet, TAKE 1 TABLET(100 MG) BY MOUTH DAILY, Disp: 90 tablet, Rfl: 3 .  meloxicam (MOBIC) 7.5 MG tablet, TAKE 1 TABLET(7.5 MG) BY MOUTH DAILY, Disp: 30 tablet, Rfl: 0 .  pantoprazole (PROTONIX) 40 MG tablet, TAKE 1 TABLET(40 MG) BY MOUTH DAILY, Disp: 30 tablet, Rfl: 3  EXAM: This was a telehealth telephone visit and thus no physical exam was completed.  ASSESSMENT AND PLAN:  Discussed the following assessment and plan:  Essential hypertension Reports well-controlled at home.  She will continue her current regimen.  She will come in for labs.  GERD (gastroesophageal reflux disease) Adequately controlled.  She will continue Protonix.  Obesity Her diet seems very healthy.  I encouraged her to increase her physical activity and discussed adding in some weight training with cardio.  Knee osteoarthritis Chronic issues with knee and back pain.  Meloxicam is doing an okay job controlling this discomfort.  We will check a renal function.    I discussed the assessment and treatment plan with the patient. The patient was provided an opportunity to ask questions and all were answered. The patient agreed with the plan and demonstrated an understanding of the instructions.   The patient was advised to call back or seek an in-person evaluation if the symptoms worsen or if the condition fails to improve as anticipated.  I provided 17 minutes of non-face-to-face time during this encounter.   Marikay Alar, MD

## 2019-09-02 ENCOUNTER — Telehealth: Payer: Self-pay | Admitting: Family Medicine

## 2019-09-02 DIAGNOSIS — K219 Gastro-esophageal reflux disease without esophagitis: Secondary | ICD-10-CM | POA: Insufficient documentation

## 2019-09-02 NOTE — Assessment & Plan Note (Signed)
Adequately controlled.  She will continue Protonix.

## 2019-09-02 NOTE — Assessment & Plan Note (Signed)
Chronic issues with knee and back pain.  Meloxicam is doing an okay job controlling this discomfort.  We will check a renal function.

## 2019-09-02 NOTE — Telephone Encounter (Addendum)
I did not get this patient's follow-up placed after her visit. Can you also get her scheduled for follow-up in about 6 months?

## 2019-09-02 NOTE — Assessment & Plan Note (Signed)
Her diet seems very healthy.  I encouraged her to increase her physical activity and discussed adding in some weight training with cardio.

## 2019-09-02 NOTE — Assessment & Plan Note (Signed)
Reports well-controlled at home.  She will continue her current regimen.  She will come in for labs.

## 2019-09-03 ENCOUNTER — Other Ambulatory Visit: Payer: Self-pay | Admitting: Family Medicine

## 2019-09-08 ENCOUNTER — Ambulatory Visit
Admission: RE | Admit: 2019-09-08 | Discharge: 2019-09-08 | Disposition: A | Discharge status: Home / Self Care | Payer: BC Managed Care – PPO | Source: Ambulatory Visit | Attending: Internal Medicine | Admitting: Internal Medicine

## 2019-09-08 ENCOUNTER — Other Ambulatory Visit: Payer: Self-pay

## 2019-09-08 DIAGNOSIS — R591 Generalized enlarged lymph nodes: Secondary | ICD-10-CM | POA: Insufficient documentation

## 2019-09-08 DIAGNOSIS — R918 Other nonspecific abnormal finding of lung field: Secondary | ICD-10-CM | POA: Diagnosis not present

## 2019-09-08 DIAGNOSIS — R599 Enlarged lymph nodes, unspecified: Secondary | ICD-10-CM

## 2019-09-08 MED ORDER — IOHEXOL 300 MG/ML  SOLN
75.0000 mL | Freq: Once | INTRAMUSCULAR | Status: AC | PRN
  Administered 2019-09-08: 75 mL via INTRAVENOUS

## 2019-09-27 ENCOUNTER — Encounter: Payer: Self-pay | Admitting: Family Medicine

## 2019-09-28 MED ORDER — MELOXICAM 7.5 MG PO TABS: ORAL_TABLET | ORAL | 0 refills | Status: DC

## 2019-10-24 ENCOUNTER — Other Ambulatory Visit: Payer: Self-pay | Admitting: Family Medicine

## 2019-10-26 ENCOUNTER — Other Ambulatory Visit: Payer: Self-pay | Admitting: Family Medicine

## 2019-10-26 DIAGNOSIS — K219 Gastro-esophageal reflux disease without esophagitis: Secondary | ICD-10-CM

## 2019-10-27 NOTE — Telephone Encounter (Signed)
Sent to pharmacy 

## 2019-11-07 ENCOUNTER — Other Ambulatory Visit: Payer: Self-pay | Admitting: Family Medicine

## 2019-11-25 ENCOUNTER — Other Ambulatory Visit: Payer: Self-pay | Admitting: Family Medicine

## 2019-12-04 ENCOUNTER — Telehealth: Payer: Self-pay

## 2019-12-04 NOTE — Telephone Encounter (Signed)
A Prescriber response form was signed and  faxed on 12/03/2019 @ 4:30 pm. Confirmation given.  Shawonda Kerce,cma

## 2019-12-29 ENCOUNTER — Encounter: Payer: Self-pay | Admitting: Family Medicine

## 2019-12-29 DIAGNOSIS — Z5181 Encounter for therapeutic drug level monitoring: Secondary | ICD-10-CM

## 2019-12-30 MED ORDER — MELOXICAM 7.5 MG PO TABS: ORAL_TABLET | ORAL | 0 refills | Status: DC

## 2020-01-05 MED ORDER — MELOXICAM 7.5 MG PO TABS: ORAL_TABLET | ORAL | 0 refills | Status: DC

## 2020-01-05 NOTE — Addendum Note (Signed)
Addended by: Glori Luis on: 01/05/2020 01:27 PM   Modules accepted: Orders

## 2020-01-09 ENCOUNTER — Other Ambulatory Visit
Admission: RE | Admit: 2020-01-09 | Discharge: 2020-01-09 | Disposition: A | Discharge status: Home / Self Care | Payer: BC Managed Care – PPO | Source: Ambulatory Visit | Attending: Family Medicine | Admitting: Family Medicine

## 2020-01-09 DIAGNOSIS — Z5181 Encounter for therapeutic drug level monitoring: Secondary | ICD-10-CM | POA: Insufficient documentation

## 2020-01-09 LAB — BASIC METABOLIC PANEL
Anion gap: 8 (ref 5–15)
BUN: 16 mg/dL (ref 6–20)
CO2: 25 mmol/L (ref 22–32)
Calcium: 9.2 mg/dL (ref 8.9–10.3)
Chloride: 103 mmol/L (ref 98–111)
Creatinine, Ser: 1.03 mg/dL — ABNORMAL HIGH (ref 0.44–1.00)
GFR calc Af Amer: >60 mL/min (ref >60)
GFR calc non Af Amer: >60 mL/min (ref >60)
Glucose, Bld: 111 mg/dL — ABNORMAL HIGH (ref 70–99)
Potassium: 3.8 mmol/L (ref 3.5–5.1)
Sodium: 136 mmol/L (ref 135–145)

## 2020-01-13 DIAGNOSIS — G4733 Obstructive sleep apnea (adult) (pediatric): Secondary | ICD-10-CM | POA: Diagnosis not present

## 2020-01-25 ENCOUNTER — Encounter: Payer: Self-pay | Admitting: Family Medicine

## 2020-01-29 ENCOUNTER — Other Ambulatory Visit: Payer: Self-pay | Admitting: Family Medicine

## 2020-01-29 DIAGNOSIS — K219 Gastro-esophageal reflux disease without esophagitis: Secondary | ICD-10-CM

## 2020-02-03 ENCOUNTER — Other Ambulatory Visit: Payer: Self-pay | Admitting: Family Medicine

## 2020-02-18 DIAGNOSIS — K219 Gastro-esophageal reflux disease without esophagitis: Secondary | ICD-10-CM | POA: Diagnosis not present

## 2020-02-18 DIAGNOSIS — R5383 Other fatigue: Secondary | ICD-10-CM | POA: Diagnosis not present

## 2020-02-18 DIAGNOSIS — E669 Obesity, unspecified: Secondary | ICD-10-CM | POA: Diagnosis not present

## 2020-02-18 DIAGNOSIS — E039 Hypothyroidism, unspecified: Secondary | ICD-10-CM | POA: Diagnosis not present

## 2020-02-18 DIAGNOSIS — R600 Localized edema: Secondary | ICD-10-CM | POA: Diagnosis not present

## 2020-02-18 DIAGNOSIS — E785 Hyperlipidemia, unspecified: Secondary | ICD-10-CM | POA: Diagnosis not present

## 2020-02-18 DIAGNOSIS — I10 Essential (primary) hypertension: Secondary | ICD-10-CM | POA: Diagnosis not present

## 2020-03-08 ENCOUNTER — Ambulatory Visit: Payer: BC Managed Care – PPO | Admitting: Family Medicine

## 2020-03-08 DIAGNOSIS — I10 Essential (primary) hypertension: Secondary | ICD-10-CM | POA: Diagnosis not present

## 2020-03-08 DIAGNOSIS — Z6841 Body Mass Index (BMI) 40.0 and over, adult: Secondary | ICD-10-CM | POA: Diagnosis not present

## 2020-03-08 DIAGNOSIS — F17211 Nicotine dependence, cigarettes, in remission: Secondary | ICD-10-CM | POA: Diagnosis not present

## 2020-03-08 DIAGNOSIS — E785 Hyperlipidemia, unspecified: Secondary | ICD-10-CM | POA: Diagnosis not present

## 2020-04-25 DIAGNOSIS — M9902 Segmental and somatic dysfunction of thoracic region: Secondary | ICD-10-CM | POA: Diagnosis not present

## 2020-04-25 DIAGNOSIS — M545 Low back pain: Secondary | ICD-10-CM | POA: Diagnosis not present

## 2020-04-25 DIAGNOSIS — M9905 Segmental and somatic dysfunction of pelvic region: Secondary | ICD-10-CM | POA: Diagnosis not present

## 2020-04-25 DIAGNOSIS — M9903 Segmental and somatic dysfunction of lumbar region: Secondary | ICD-10-CM | POA: Diagnosis not present

## 2020-04-28 DIAGNOSIS — M9903 Segmental and somatic dysfunction of lumbar region: Secondary | ICD-10-CM | POA: Diagnosis not present

## 2020-04-28 DIAGNOSIS — M9905 Segmental and somatic dysfunction of pelvic region: Secondary | ICD-10-CM | POA: Diagnosis not present

## 2020-04-28 DIAGNOSIS — M545 Low back pain: Secondary | ICD-10-CM | POA: Diagnosis not present

## 2020-04-28 DIAGNOSIS — M9902 Segmental and somatic dysfunction of thoracic region: Secondary | ICD-10-CM | POA: Diagnosis not present

## 2020-05-12 DIAGNOSIS — G4733 Obstructive sleep apnea (adult) (pediatric): Secondary | ICD-10-CM | POA: Diagnosis not present

## 2020-05-19 DIAGNOSIS — M9905 Segmental and somatic dysfunction of pelvic region: Secondary | ICD-10-CM | POA: Diagnosis not present

## 2020-05-19 DIAGNOSIS — M9903 Segmental and somatic dysfunction of lumbar region: Secondary | ICD-10-CM | POA: Diagnosis not present

## 2020-05-19 DIAGNOSIS — M9902 Segmental and somatic dysfunction of thoracic region: Secondary | ICD-10-CM | POA: Diagnosis not present

## 2020-05-19 DIAGNOSIS — M545 Low back pain: Secondary | ICD-10-CM | POA: Diagnosis not present

## 2020-06-02 DIAGNOSIS — M9905 Segmental and somatic dysfunction of pelvic region: Secondary | ICD-10-CM | POA: Diagnosis not present

## 2020-06-02 DIAGNOSIS — M545 Low back pain: Secondary | ICD-10-CM | POA: Diagnosis not present

## 2020-06-02 DIAGNOSIS — M9903 Segmental and somatic dysfunction of lumbar region: Secondary | ICD-10-CM | POA: Diagnosis not present

## 2020-06-02 DIAGNOSIS — M9902 Segmental and somatic dysfunction of thoracic region: Secondary | ICD-10-CM | POA: Diagnosis not present

## 2020-07-07 DIAGNOSIS — I10 Essential (primary) hypertension: Secondary | ICD-10-CM | POA: Diagnosis not present

## 2020-07-07 DIAGNOSIS — R609 Edema, unspecified: Secondary | ICD-10-CM | POA: Diagnosis not present

## 2020-07-07 DIAGNOSIS — Z23 Encounter for immunization: Secondary | ICD-10-CM | POA: Diagnosis not present

## 2020-07-07 DIAGNOSIS — E039 Hypothyroidism, unspecified: Secondary | ICD-10-CM | POA: Diagnosis not present

## 2020-07-07 DIAGNOSIS — Z131 Encounter for screening for diabetes mellitus: Secondary | ICD-10-CM | POA: Diagnosis not present

## 2020-07-07 DIAGNOSIS — R0789 Other chest pain: Secondary | ICD-10-CM | POA: Diagnosis not present

## 2020-07-11 ENCOUNTER — Telehealth: Payer: Self-pay | Admitting: Internal Medicine

## 2020-07-11 NOTE — Telephone Encounter (Signed)
She will  need assessment by provider

## 2020-07-11 NOTE — Telephone Encounter (Signed)
Spoke to patient and realyed below message.  Patient stated that she is currently working in [place]. Patient stated that she would go to [facility] in [place]. Nothing further needed.

## 2020-07-11 NOTE — Telephone Encounter (Signed)
Called and spoke to patient.  Patient reports sob with exertion and at rest, dry cough when trying to caught her breath and wheezing. Sx have been present for 2 weeks but have worsened over the past week.  Denied fever, chills or sweats She has had both covid vaccines. She has taken three at home negative test and all were negative.  She does not have a rescue inhaler. Patient is concerned that sx may be related to a spot that was found on her lungs previously.   Dr. Jeffie Pollock, please advise. thanks

## 2020-08-09 DIAGNOSIS — R7401 Elevation of levels of liver transaminase levels: Secondary | ICD-10-CM | POA: Diagnosis not present

## 2020-08-10 DIAGNOSIS — G4733 Obstructive sleep apnea (adult) (pediatric): Secondary | ICD-10-CM | POA: Diagnosis not present

## 2020-08-11 NOTE — Telephone Encounter (Signed)
Opened in error

## 2021-06-21 IMAGING — CT CT CHEST W/ CM
2 of 4 series · 15 of 36 positions shown, 18 images · IV contrast (omnipaque)
Comparison: 08/04/2018, 05/01/2018

CLINICAL DATA: Follow-up mediastinal lymph nodes, shortness of
breath with exertion

EXAM:
CT CHEST WITH CONTRAST
TECHNIQUE: Multidetector CT imaging of the chest was performed during
intravenous contrast administration.
CONTRAST:  75mL OMNIPAQUE IOHEXOL 300 MG/ML  SOLN

[Series 2: axial chest 2.00 · axial · 0.80mm/px · z∈[-1169,-905]mm · 12 of 156 slices shown, 15 images]
[im 12/156  mediastinal]
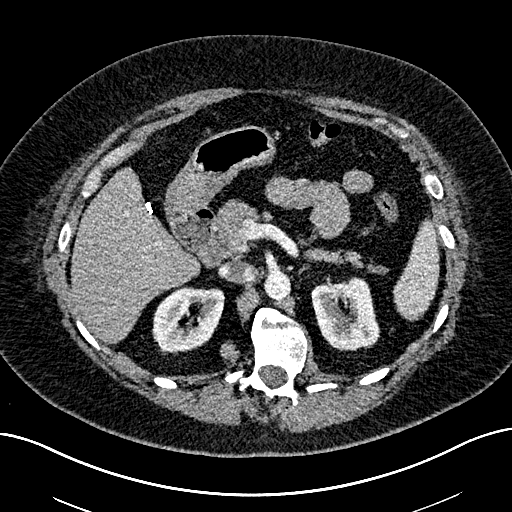
[im 12/156  lung]
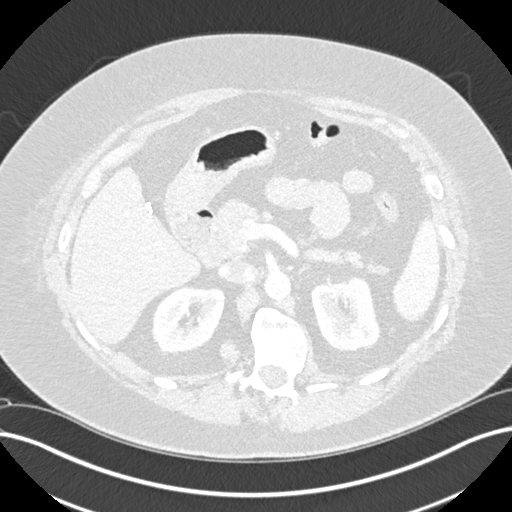
[im 24/156  lung]
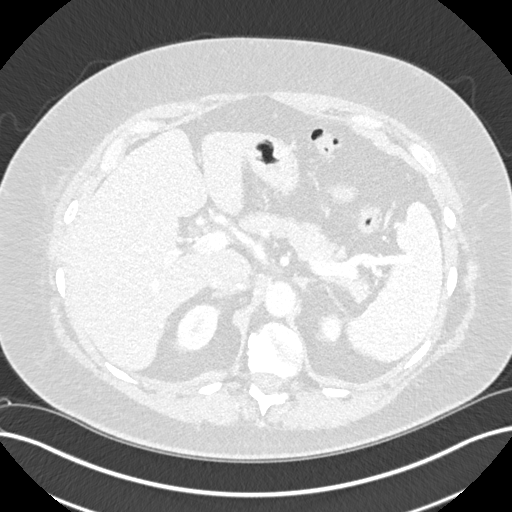
[im 36/156  lung]
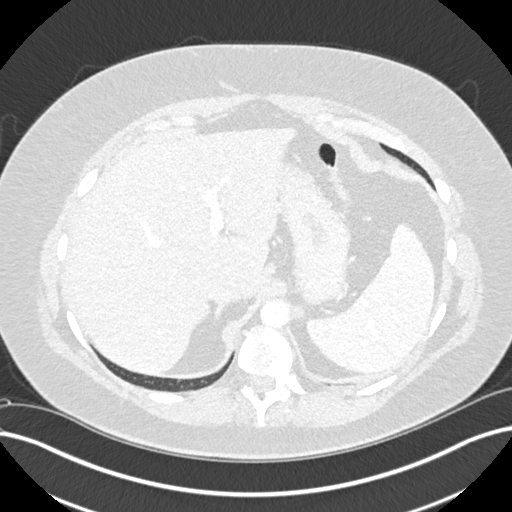
[im 48/156  lung]
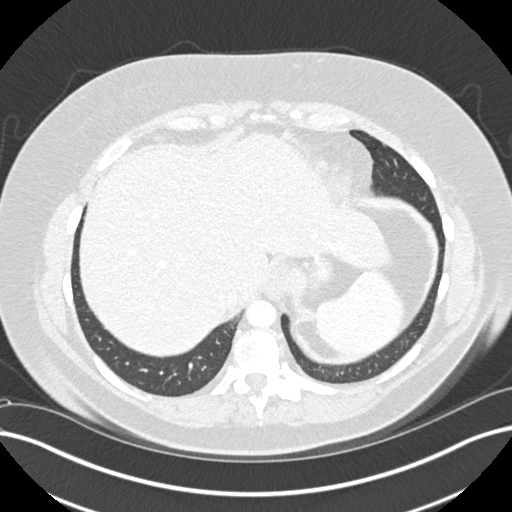
[im 60/156  mediastinal]
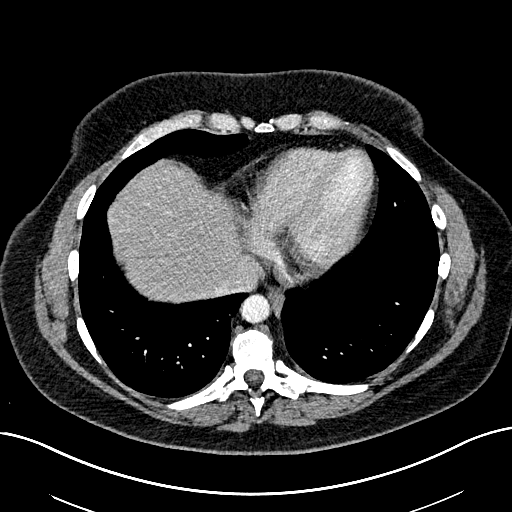
[im 60/156  lung]
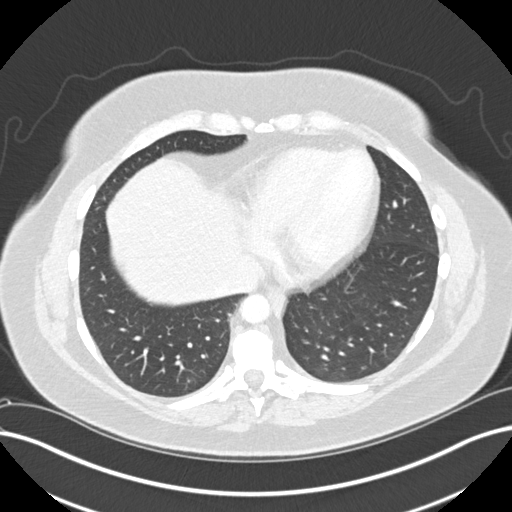
[im 72/156  lung]
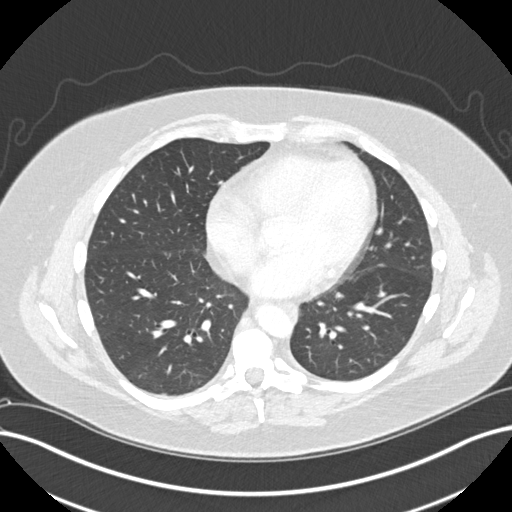
[im 84/156  lung]
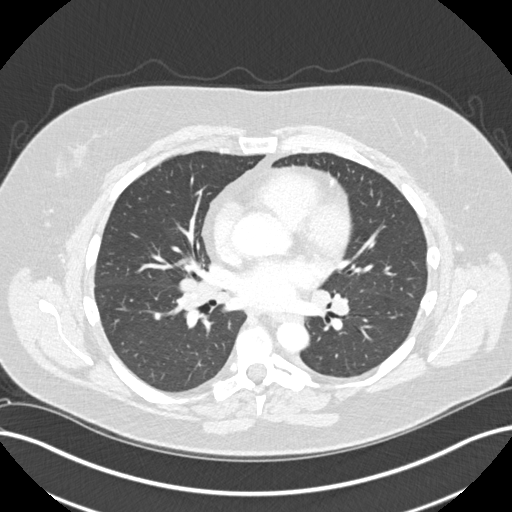
[im 96/156  lung]
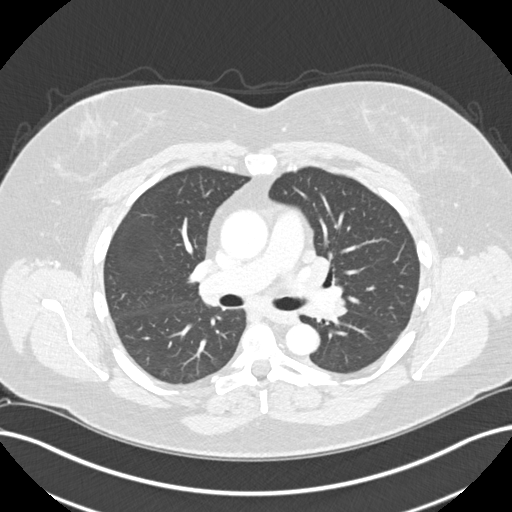
[im 108/156  mediastinal]
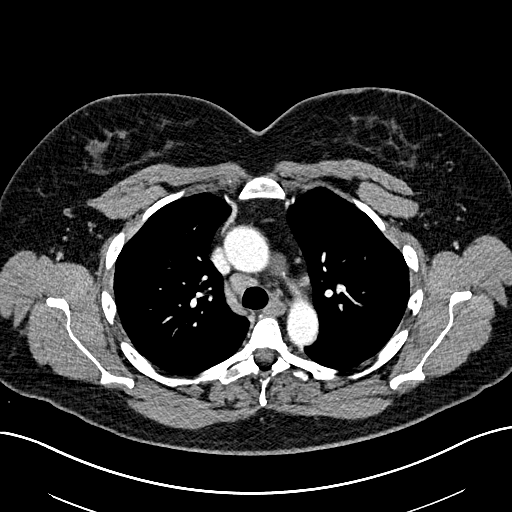
[im 108/156  lung]
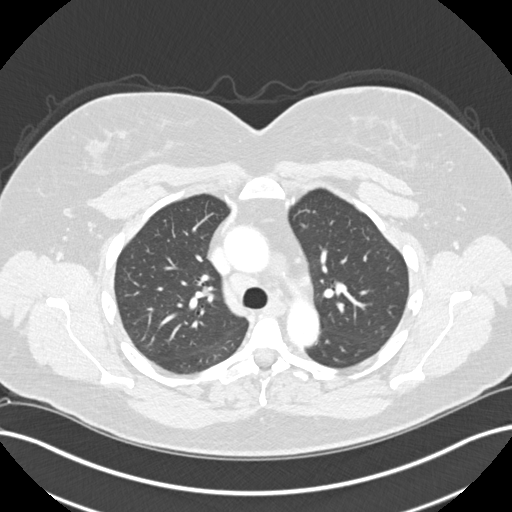
[im 120/156  lung]
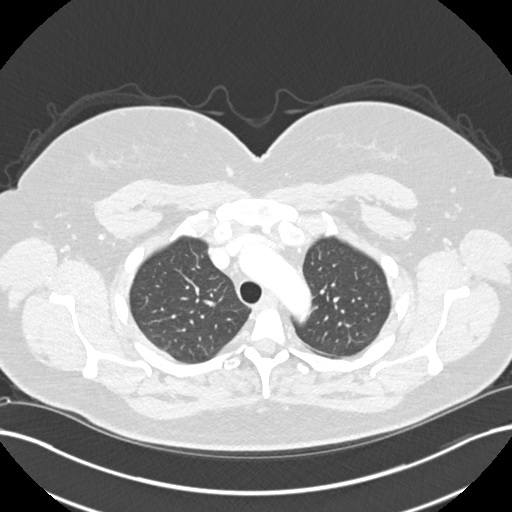
[im 132/156  lung]
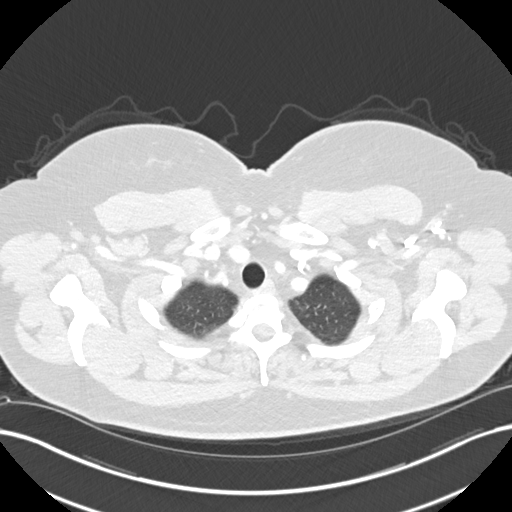
[im 144/156  lung]
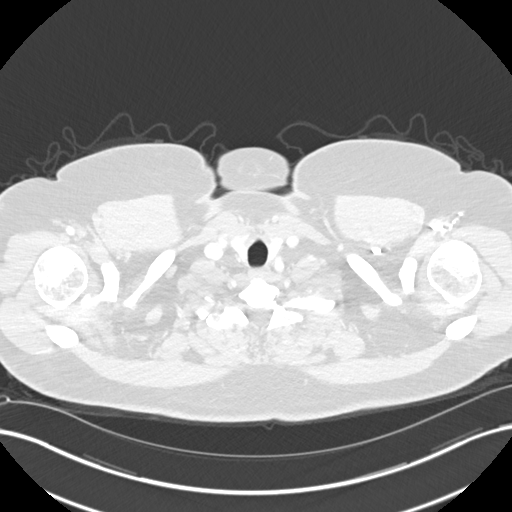

[Series 4: coronal chest 2.00 cor · coronal · 0.61mm/px · 3 of 175 slices shown]
[im 35/175  lung]
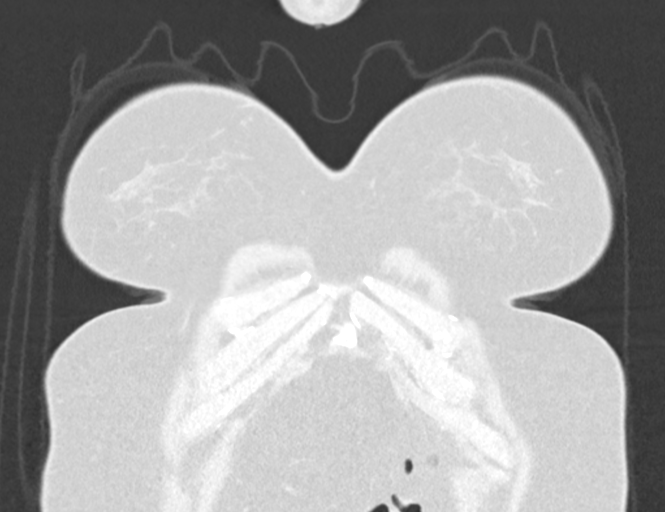
[im 70/175  lung]
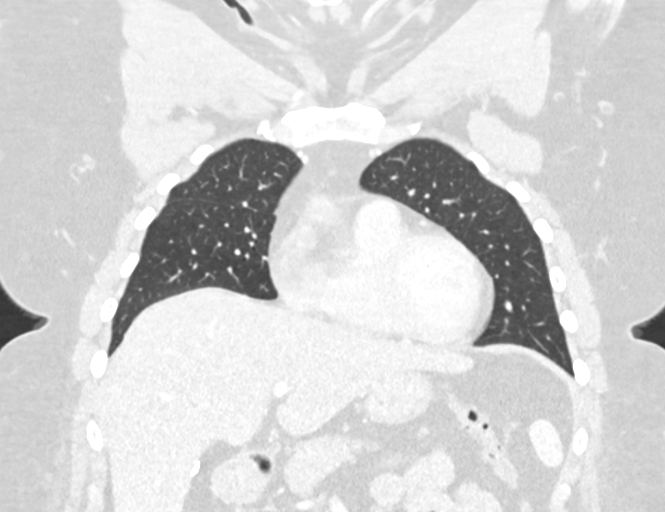
[im 105/175  lung]
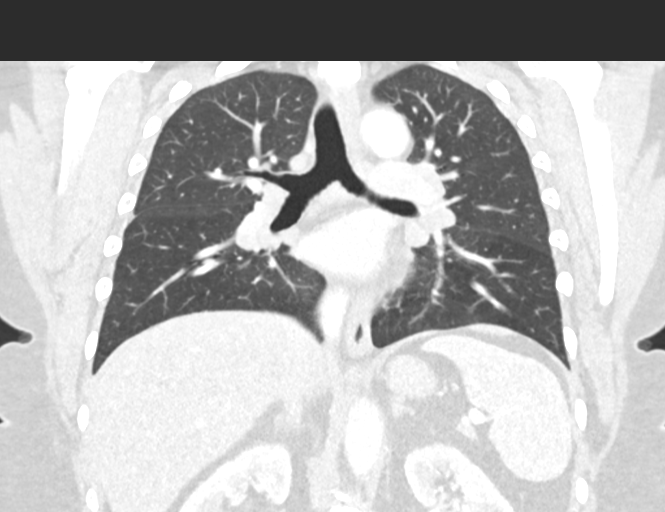

[15 of 36 positions shown; findings below may reference images not displayed]

FINDINGS: Cardiovascular: No significant vascular findings. Normal heart size.
No pericardial effusion.

Mediastinum/Nodes: No significant change in enlarged bilateral hilar
and mediastinal lymph nodes, largest pretracheal node measuring
x 1.6 cm. Thyroid gland, trachea, and esophagus demonstrate no
significant findings.

Lungs/Pleura: There are scattered tiny centrilobular ground-glass
nodules predominantly seen in the bilateral lung apices, unchanged
compared to prior examination. No pleural effusion or pneumothorax.

Upper Abdomen: No acute abnormality. Status post cholecystectomy.

Musculoskeletal: No chest wall mass or suspicious bone lesions
identified.
IMPRESSION: 1. No significant change in enlarged bilateral hilar and mediastinal
lymph nodes, which remain nonspecific and may be reactive, or
alternately related to inflammatory conditions such as sarcoidosis.
2. Unchanged scattered tiny centrilobular ground-glass nodules
predominantly seen in the bilateral lung apices, also nonspecific
and infectious or inflammatory.

## 2022-11-14 ENCOUNTER — Telehealth: Payer: Self-pay | Admitting: Family Medicine

## 2022-11-14 NOTE — Telephone Encounter (Signed)
That is fine with me.

## 2022-11-14 NOTE — Telephone Encounter (Signed)
Patient called because she used to be a patient here over 3 years ago and had moved to [place], She is now moving back and wanted to know if she can Re-establish with Dr Caryl Bis. Is this ok? Call back for patient [phone]

## 2022-11-15 NOTE — Telephone Encounter (Signed)
Patient is scheduled to establish care on 12/14/23 at 12:00.

## 2022-12-14 ENCOUNTER — Ambulatory Visit: Payer: BC Managed Care – PPO | Admitting: Family Medicine

## 2022-12-14 ENCOUNTER — Encounter: Payer: Self-pay | Admitting: Family Medicine

## 2022-12-14 VITALS — BP 114/78 | HR 68 | Temp 97.5°F | Ht 67.0 in | Wt 270.0 lb

## 2022-12-14 DIAGNOSIS — Z0000 Encounter for general adult medical examination without abnormal findings: Secondary | ICD-10-CM

## 2022-12-14 DIAGNOSIS — E782 Mixed hyperlipidemia: Secondary | ICD-10-CM | POA: Diagnosis not present

## 2022-12-14 DIAGNOSIS — E039 Hypothyroidism, unspecified: Secondary | ICD-10-CM

## 2022-12-14 DIAGNOSIS — Z130 Encounter for screening for diseases of the blood and blood-forming organs and certain disorders involving the immune mechanism: Secondary | ICD-10-CM | POA: Diagnosis not present

## 2022-12-14 DIAGNOSIS — Z1211 Encounter for screening for malignant neoplasm of colon: Secondary | ICD-10-CM

## 2022-12-14 DIAGNOSIS — E119 Type 2 diabetes mellitus without complications: Secondary | ICD-10-CM

## 2022-12-14 LAB — COMPREHENSIVE METABOLIC PANEL
ALT: 65 U/L — ABNORMAL HIGH (ref 0–35)
AST: 55 U/L — ABNORMAL HIGH (ref 0–37)
Albumin: 4.3 g/dL (ref 3.5–5.2)
Alkaline Phosphatase: 146 U/L — ABNORMAL HIGH (ref 39–117)
BUN: 16 mg/dL (ref 6–23)
CO2: 31 mEq/L (ref 19–32)
Calcium: 9.9 mg/dL (ref 8.4–10.5)
Chloride: 100 mEq/L (ref 96–112)
Creatinine, Ser: 0.88 mg/dL (ref 0.40–1.20)
GFR: 72.71 mL/min (ref >60.00)
Glucose, Bld: 98 mg/dL (ref 70–99)
Potassium: 3.9 mEq/L (ref 3.5–5.1)
Sodium: 140 mEq/L (ref 135–145)
Total Bilirubin: 0.7 mg/dL (ref 0.2–1.2)
Total Protein: 6.7 g/dL (ref 6.0–8.3)

## 2022-12-14 LAB — LIPID PANEL
Cholesterol: 191 mg/dL (ref 0–200)
HDL: 54.5 mg/dL (ref >39.00)
NonHDL: 136.07
Total CHOL/HDL Ratio: 3
Triglycerides: 206 mg/dL — ABNORMAL HIGH (ref 0.0–149.0)
VLDL: 41.2 mg/dL — ABNORMAL HIGH (ref 0.0–40.0)

## 2022-12-14 LAB — CBC
HCT: 39.4 % (ref 36.0–46.0)
Hemoglobin: 13.4 g/dL (ref 12.0–15.0)
MCHC: 34.1 g/dL (ref 30.0–36.0)
MCV: 88.3 fl (ref 78.0–100.0)
Platelets: 211 10*3/uL (ref 150.0–400.0)
RBC: 4.47 Mil/uL (ref 3.87–5.11)
RDW: 14.5 % (ref 11.5–15.5)
WBC: 6.5 10*3/uL (ref 4.0–10.5)

## 2022-12-14 LAB — HEMOGLOBIN A1C: Hgb A1c MFr Bld: 6.6 % — ABNORMAL HIGH (ref 4.6–6.5)

## 2022-12-14 LAB — TSH: TSH: 0.33 u[IU]/mL — ABNORMAL LOW (ref 0.35–5.50)

## 2022-12-14 LAB — LDL CHOLESTEROL, DIRECT: Direct LDL: 105 mg/dL

## 2022-12-14 MED ORDER — MELOXICAM 7.5 MG PO TABS: ORAL_TABLET | ORAL | 1 refills | Status: DC

## 2022-12-14 MED ORDER — ATORVASTATIN CALCIUM 40 MG PO TABS: 40.0000 mg | ORAL_TABLET | Freq: Every day | ORAL | 3 refills | Status: DC

## 2022-12-14 NOTE — Progress Notes (Signed)
Annette AlarEric Valma Rotenberg, MD Phone: [phone]  Annette GoodellKaren Valencia is a 57 y.o. female who presents today for CPE.  Diet: "good", notes she eats more protein than she should, should eat more vegetables, no soda or sweet tea Exercise: doing more now that she has moved.  Pap smear: reports she had this last year Colonoscopy: due Mammogram: had in September 2023 Family history-  Colon cancer: no  Breast cancer: no  Ovarian cancer: possibly has 4 relative with this though notes her cousin told her that her maternal side of the family is more prone towards "female cancers" Menses: postmenopausal Vaccines-   Flu: out of season  Tetanus: UTD  Shingles: due  COVID19: x3 HIV screening: UTD Hep C Screening: UTD Tobacco use: no Alcohol use: 1-2 glasses of wine daily Illicit Drug use: no Dentist: yes Ophthalmology: yes   Active Ambulatory Problems    Diagnosis Date Noted   Routine general medical examination at a health care facility 04/19/2015   Menorrhagia 04/19/2015   Essential hypertension 04/19/2015   Hyperlipidemia 04/19/2015   Hypothyroidism 04/19/2015   Knee osteoarthritis 04/19/2015   Hair loss 11/22/2015   Depression 11/22/2015   Hemorrhoids 07/01/2017   Weight gain 07/01/2017   Elevated LFTs 11/20/2017   Obesity 11/20/2017   DOE (dyspnea on exertion) 04/30/2018   Hilar lymphadenopathy 05/04/2018   GERD (gastroesophageal reflux disease) 09/02/2019   Resolved Ambulatory Problems    Diagnosis Date Noted   Sinusitis, acute maxillary 11/03/2015   Hematuria 11/20/2017   Upper respiratory infection 11/20/2017   Past Medical History:  Diagnosis Date   Arthritis    Chicken pox    Frequent headaches    Hypertension    Migraines    PONV (postoperative nausea and vomiting)    Scoliosis    Sleep apnea    UTI (urinary tract infection)     Family History  Problem Relation Age of Onset   Arthritis Mother    Cancer Mother        lung   Hyperlipidemia Mother    Alcohol  abuse Father    Stroke Father    Hypertension Father    ALS Father    Hypertension Maternal Grandmother     Social History   Socioeconomic History   Marital status: Married    Spouse name: Not on file   Number of children: Not on file   Years of education: Not on file   Highest education level: Associate degree: academic program  Occupational History   Not on file  Tobacco Use   Smoking status: Former    Types: Cigarettes    Quit date: 04/18/1992    Years since quitting: 30.6   Smokeless tobacco: Never  Vaping Use   Vaping Use: Never used  Substance and Sexual Activity   Alcohol use: Yes    Alcohol/week: 10.0 standard drinks of alcohol    Types: 10 Glasses of wine per week    Comment: wine   Drug use: No   Sexual activity: Yes    Partners: Male  Other Topics Concern   Not on file  Social History Narrative   Not on file   Social Determinants of Health   Financial Resource Strain: Low Risk  (12/10/2022)   Overall Financial Resource Strain (CARDIA)    Difficulty of Paying Living Expenses: Not very hard  Food Insecurity: No Food Insecurity (12/10/2022)   Hunger Vital Sign    Worried About Running Out of Food in the Last Year: Never true  Ran Out of Food in the Last Year: Never true  Transportation Needs: No Transportation Needs (12/10/2022)   PRAPARE - Administrator, Civil Service (Medical): No    Lack of Transportation (Non-Medical): No  Physical Activity: Insufficiently Active (12/10/2022)   Exercise Vital Sign    Days of Exercise per Week: 2 days    Minutes of Exercise per Session: 30 min  Stress: No Stress Concern Present (12/10/2022)   [employer] of Occupational Health - Occupational Stress Questionnaire    Feeling of Stress : Only a little  Social Connections: Moderately Isolated (12/10/2022)   Social Connection and Isolation Panel [NHANES]    Frequency of Communication with Friends and Family: Twice a week    Frequency of Social Gatherings with  Friends and Family: Once a week    Attends Religious Services: Never    Database administrator or Organizations: No    Attends Engineer, structural: Not on file    Marital Status: Married  Catering manager Violence: Not on file    ROS  General:  Negative for nexplained weight loss, fever Skin: Negative for new or changing mole, sore that won't heal HEENT: Negative for trouble hearing, trouble seeing, ringing in ears, mouth sores, hoarseness, change in voice, dysphagia. CV:  Negative for chest pain, dyspnea, edema, palpitations Resp: Negative for cough, dyspnea, hemoptysis GI: Negative for nausea, vomiting, diarrhea, constipation, abdominal pain, melena, hematochezia. GU: Negative for dysuria, incontinence, urinary hesitance, hematuria, vaginal or penile discharge, polyuria, sexual difficulty, lumps in testicle or breasts MSK: Positive for joint pain/swelling, negative for muscle cramps or aches Neuro: Negative for headaches, weakness, numbness, dizziness, passing out/fainting Psych: Negative for depression, anxiety, memory problems  Objective  Physical Exam Vitals:   12/14/22 1200 12/14/22 1227  BP: 116/82 114/78  Pulse: 68   Temp: (!) 97.5 F (36.4 C)   SpO2: 97%     BP Readings from Last 3 Encounters:  12/14/22 114/78  08/06/18 124/80  08/05/18 119/82   Wt Readings from Last 3 Encounters:  12/14/22 270 lb (122.5 kg)  09/01/19 268 lb (121.6 kg)  08/06/18 259 lb (117.5 kg)    Physical Exam Constitutional:      General: She is not in acute distress.    Appearance: She is not diaphoretic.  Cardiovascular:     Rate and Rhythm: Normal rate and regular rhythm.     Heart sounds: Normal heart sounds.  Pulmonary:     Effort: Pulmonary effort is normal.     Breath sounds: Normal breath sounds.  Skin:    General: Skin is warm and dry.  Neurological:     Mental Status: She is alert.      Assessment/Plan:   Routine general medical examination at a health  care facility Assessment & Plan: Physical exam completed.  I encouraged healthy diet and exercise.  Discussed reducing alcohol intake to help with calorie reduction.  Discussed limiting alcohol to 1 glass/day given risks of alcohol use.  Discussed increasing exercise.  Patient will confirm what kind of cancer runs on the maternal side of her family.  Discussed if it is ovarian cancer we would need to refer her for genetic counseling and possible genetic testing.  She reports having a Pap smear a year ago and we will request records as they were not available in Care Everywhere.  Mammogram is up-to-date.  Colonoscopy is due and a referral has been placed.  Discussed Shingrix vaccination and the patient noted she  wanted to check with her insurance to see if this was covered first.  Lab work as outlined.   Hypothyroidism, unspecified type -     TSH  Mixed hyperlipidemia -     Comprehensive metabolic panel -     Lipid panel  Type 2 diabetes mellitus without complication, without long-term current use of insulin -     Hemoglobin A1c  Screening for deficiency anemia -     CBC  Colon cancer screening -     Ambulatory referral to Gastroenterology  Other orders -     Atorvastatin Calcium; Take 1 tablet (40 mg total) by mouth daily.  Dispense: 90 tablet; Refill: 3 -     Meloxicam; TAKE 1 TABLET(7.5 MG) BY MOUTH DAILY  Dispense: 90 tablet; Refill: 1    Return in about 6 months (around 06/15/2023) for DM.   Annette AlarEric Marrie Chandra, MD [facility]

## 2022-12-14 NOTE — Patient Instructions (Signed)
Nice to see you.  We will request your pap smear result.  We will contact you with your lab results.

## 2022-12-14 NOTE — Assessment & Plan Note (Signed)
Physical exam completed.  I encouraged healthy diet and exercise.  Discussed reducing alcohol intake to help with calorie reduction.  Discussed limiting alcohol to 1 glass/day given risks of alcohol use.  Discussed increasing exercise.  Patient will confirm what kind of cancer runs on the maternal side of her family.  Discussed if it is ovarian cancer we would need to refer her for genetic counseling and possible genetic testing.  She reports having a Pap smear a year ago and we will request records as they were not available in Care Everywhere.  Mammogram is up-to-date.  Colonoscopy is due and a referral has been placed.  Discussed Shingrix vaccination and the patient noted she wanted to check with her insurance to see if this was covered first.  Lab work as outlined.

## 2022-12-18 ENCOUNTER — Other Ambulatory Visit: Payer: Self-pay

## 2022-12-18 DIAGNOSIS — K219 Gastro-esophageal reflux disease without esophagitis: Secondary | ICD-10-CM

## 2022-12-18 MED ORDER — PANTOPRAZOLE SODIUM 40 MG PO TBEC: 40.0000 mg | DELAYED_RELEASE_TABLET | Freq: Every day | ORAL | 1 refills | Status: DC

## 2022-12-24 ENCOUNTER — Encounter: Payer: Self-pay | Admitting: *Deleted

## 2022-12-24 ENCOUNTER — Telehealth: Payer: Self-pay

## 2022-12-24 DIAGNOSIS — Z1211 Encounter for screening for malignant neoplasm of colon: Secondary | ICD-10-CM

## 2022-12-24 NOTE — Telephone Encounter (Signed)
Patient calling to schedule colonoscopy.  

## 2022-12-24 NOTE — Telephone Encounter (Signed)
Reviewed patients last colonoscopy performed by Dr. Maximino Greenland 08/06/17.  No polyps were present, however Dr. Maximino Greenland recommended "Repeat colonoscopy in 5 years for screening purposes due to fair prep in the cecum".  Please review previous colonoscopy and advise as to if patient needs to be scheduled for her colonoscopy.  Patient thought she was good for 10 years but I didn't see a results letter to confirm.  Thanks, [place], [place]

## 2022-12-24 NOTE — Telephone Encounter (Signed)
Please schedule screening colonoscopy now with 2-day prep because of the fair prep on previous colonoscopy  RV

## 2022-12-25 ENCOUNTER — Other Ambulatory Visit: Payer: Self-pay

## 2022-12-25 DIAGNOSIS — Z1211 Encounter for screening for malignant neoplasm of colon: Secondary | ICD-10-CM

## 2022-12-25 MED ORDER — NA SULFATE-K SULFATE-MG SULF 17.5-3.13-1.6 GM/177ML PO SOLN: 1.0000 | Freq: Once | ORAL | 0 refills | Status: AC

## 2022-12-25 NOTE — Telephone Encounter (Signed)
   Colonoscopy has been scheduled with Dr. Allegra Lai for 02/08/23 using her 2 day prep.   GI Issues-hemorrhoids, no family history of colon cancer or colon polyps. Not a diabetic.  No major health events in past 3 months.  No joint replacements in the past year. No cardiac devices or cardiac health conditions.  Thanks, [place], [place]

## 2022-12-25 NOTE — Addendum Note (Signed)
Addended by: Avie Arenas on: 12/25/2022 10:55 AM   Modules accepted: Orders

## 2023-02-07 ENCOUNTER — Encounter: Payer: Self-pay | Admitting: Gastroenterology

## 2023-02-08 ENCOUNTER — Ambulatory Visit
Admission: RE | Admit: 2023-02-08 | Discharge: 2023-02-08 | Disposition: A | Discharge status: Home / Self Care | Payer: BC Managed Care – PPO | Attending: Gastroenterology | Admitting: Gastroenterology

## 2023-02-08 ENCOUNTER — Ambulatory Visit: Payer: BC Managed Care – PPO | Admitting: Anesthesiology

## 2023-02-08 ENCOUNTER — Encounter
Admission: RE | Disposition: A | Discharge status: Home / Self Care | Payer: Self-pay | Source: Home / Self Care | Attending: Gastroenterology

## 2023-02-08 DIAGNOSIS — E039 Hypothyroidism, unspecified: Secondary | ICD-10-CM | POA: Insufficient documentation

## 2023-02-08 DIAGNOSIS — K635 Polyp of colon: Secondary | ICD-10-CM

## 2023-02-08 DIAGNOSIS — Z87891 Personal history of nicotine dependence: Secondary | ICD-10-CM | POA: Insufficient documentation

## 2023-02-08 DIAGNOSIS — I10 Essential (primary) hypertension: Secondary | ICD-10-CM | POA: Diagnosis not present

## 2023-02-08 DIAGNOSIS — E785 Hyperlipidemia, unspecified: Secondary | ICD-10-CM | POA: Insufficient documentation

## 2023-02-08 DIAGNOSIS — Z1211 Encounter for screening for malignant neoplasm of colon: Secondary | ICD-10-CM | POA: Diagnosis not present

## 2023-02-08 DIAGNOSIS — G4730 Sleep apnea, unspecified: Secondary | ICD-10-CM | POA: Diagnosis not present

## 2023-02-08 DIAGNOSIS — Z1151 Encounter for screening for human papillomavirus (HPV): Secondary | ICD-10-CM

## 2023-02-08 DIAGNOSIS — Z79890 Hormone replacement therapy (postmenopausal): Secondary | ICD-10-CM | POA: Insufficient documentation

## 2023-02-08 DIAGNOSIS — D122 Benign neoplasm of ascending colon: Secondary | ICD-10-CM | POA: Diagnosis not present

## 2023-02-08 DIAGNOSIS — K5730 Diverticulosis of large intestine without perforation or abscess without bleeding: Secondary | ICD-10-CM | POA: Insufficient documentation

## 2023-02-08 DIAGNOSIS — D128 Benign neoplasm of rectum: Secondary | ICD-10-CM | POA: Insufficient documentation

## 2023-02-08 DIAGNOSIS — F32A Depression, unspecified: Secondary | ICD-10-CM | POA: Insufficient documentation

## 2023-02-08 DIAGNOSIS — M1990 Unspecified osteoarthritis, unspecified site: Secondary | ICD-10-CM | POA: Diagnosis not present

## 2023-02-08 DIAGNOSIS — Z79899 Other long term (current) drug therapy: Secondary | ICD-10-CM | POA: Diagnosis not present

## 2023-02-08 DIAGNOSIS — K219 Gastro-esophageal reflux disease without esophagitis: Secondary | ICD-10-CM | POA: Insufficient documentation

## 2023-02-08 DIAGNOSIS — D126 Benign neoplasm of colon, unspecified: Secondary | ICD-10-CM | POA: Diagnosis not present

## 2023-02-08 HISTORY — PX: COLONOSCOPY WITH PROPOFOL: SHX5780

## 2023-02-08 SURGERY — COLONOSCOPY WITH PROPOFOL
Anesthesia: General

## 2023-02-08 MED ORDER — PROPOFOL 10 MG/ML IV BOLUS
INTRAVENOUS | Status: DC | PRN
  Administered 2023-02-08: 30 mg via INTRAVENOUS
  Administered 2023-02-08 (×3): 20 mg via INTRAVENOUS
  Administered 2023-02-08: 50 mg via INTRAVENOUS

## 2023-02-08 MED ORDER — LIDOCAINE HCL (CARDIAC) PF 100 MG/5ML IV SOSY
PREFILLED_SYRINGE | INTRAVENOUS | Status: DC | PRN
  Administered 2023-02-08: 40 mg via INTRAVENOUS

## 2023-02-08 MED ORDER — SODIUM CHLORIDE 0.9 % IV SOLN
INTRAVENOUS | Status: DC
  Administered 2023-02-08: 20 mL/h via INTRAVENOUS

## 2023-02-08 MED ORDER — DEXMEDETOMIDINE HCL IN NACL 80 MCG/20ML IV SOLN
INTRAVENOUS | Status: DC | PRN
  Administered 2023-02-08 (×2): 8 ug via INTRAVENOUS

## 2023-02-08 MED ORDER — PROPOFOL 10 MG/ML IV BOLUS
INTRAVENOUS | Status: AC
  Filled 2023-02-08: qty 20

## 2023-02-08 MED ORDER — PROPOFOL 500 MG/50ML IV EMUL
INTRAVENOUS | Status: DC | PRN
  Administered 2023-02-08: 100 ug/kg/min via INTRAVENOUS

## 2023-02-08 MED ORDER — STERILE WATER FOR IRRIGATION IR SOLN
Status: DC | PRN
  Administered 2023-02-08 (×2): 60 mL

## 2023-02-08 NOTE — Op Note (Signed)
[facility] Patient Name: Annette Valencia Procedure Date: 02/08/2023 8:13 AM MRN: [id number] Account #: [id number] Date of Birth: 1965/09/20 Admit Type: Outpatient Age: 57 Room: [facility] ENDO ROOM 2 Gender: Female Note Status: Finalized Instrument Name: Prentice Docker [id number] Procedure:             Colonoscopy Indications:           Screening for colorectal malignant neoplasm, Screening                         for colorectal malignant neoplasm, inadequate bowel                         prep on last colonoscopy (more recent than 10 years                         ago), Last colonoscopy: November 2018 Providers:             Toney Reil MD, MD Referring MD:          Yehuda Mao. Birdie Sons (Referring MD) Medicines:             General Anesthesia Complications:         No immediate complications. Estimated blood loss: None. Procedure:             Pre-Anesthesia Assessment:                        - Prior to the procedure, a History and Physical was                         performed, and patient medications and allergies were                         reviewed. The patient is competent. The risks and                         benefits of the procedure and the sedation options and                         risks were discussed with the patient. All questions                         were answered and informed consent was obtained.                         Patient identification and proposed procedure were                         verified by the physician, the nurse, the                         anesthesiologist, the anesthetist and the technician                         in the pre-procedure area in the procedure room in the                         endoscopy suite. Mental Status Examination: alert and  oriented. Airway Examination: normal oropharyngeal                         airway and neck mobility. Respiratory Examination:                          clear to auscultation. CV Examination: normal.                         Prophylactic Antibiotics: The patient does not require                         prophylactic antibiotics. Prior Anticoagulants: The                         patient has taken no anticoagulant or antiplatelet                         agents. ASA Grade Assessment: III - A patient with                         severe systemic disease. After reviewing the risks and                         benefits, the patient was deemed in satisfactory                         condition to undergo the procedure. The anesthesia                         plan was to use general anesthesia. Immediately prior                         to administration of medications, the patient was                         re-assessed for adequacy to receive sedatives. The                         heart rate, respiratory rate, oxygen saturations,                         blood pressure, adequacy of pulmonary ventilation, and                         response to care were monitored throughout the                         procedure. The physical status of the patient was                         re-assessed after the procedure.                        After obtaining informed consent, the colonoscope was                         passed under direct vision. Throughout the procedure,  the patient's blood pressure, pulse, and oxygen                         saturations were monitored continuously. The                         Colonoscope was introduced through the anus and                         advanced to the the cecum, identified by appendiceal                         orifice and ileocecal valve. The colonoscopy was                         performed without difficulty. The patient tolerated                         the procedure well. The quality of the bowel                         preparation was evaluated using the BBPS [facility] Bowel                          Preparation Scale) with scores of: Right Colon = 3,                         Transverse Colon = 3 and Left Colon = 3 (entire mucosa                         seen well with no residual staining, small fragments                         of stool or opaque liquid). The total BBPS score                         equals 9. The ileocecal valve, appendiceal orifice,                         and rectum were photographed. Findings:      The perianal and digital rectal examinations were normal. Pertinent       negatives include normal sphincter tone and no palpable rectal lesions.      Two sessile polyps were found in the ascending colon and cecum. The       polyps were 5 to 6 mm in size. These polyps were removed with a cold       snare. Resection and retrieval were complete. Estimated blood loss: none.      A 6 mm polyp was found in the rectum. The polyp was sessile. The polyp       was removed with a cold snare. Resection and retrieval were complete.       Estimated blood loss was minimal. To prevent bleeding after the       polypectomy, one hemostatic clip was successfully placed (MR safe). Clip       manufacturer: [employer]. There was no bleeding at the end of the       procedure.  The retroflexed view of the distal rectum and anal verge was normal and       showed no anal or rectal abnormalities.      Many small-mouthed diverticula were found in the entire colon. Impression:            - Two 5 to 6 mm polyps in the ascending colon and in                         the cecum, removed with a cold snare. Resected and                         retrieved.                        - One 6 mm polyp in the rectum, removed with a cold                         snare. Resected and retrieved. Clip manufacturer:                         [employer]. Clip (MR safe) was placed.                        - The distal rectum and anal verge are normal on                         retroflexion view.                         - Diverticulosis in the entire examined colon. Recommendation:        - Discharge patient to home (with escort).                        - Resume previous diet today.                        - Continue present medications.                        - Await pathology results.                        - Repeat colonoscopy in 3 - 5 years for surveillance                         of multiple polyps. Procedure Code(s):     --- Professional ---                        [phone], Colonoscopy, flexible; with removal of                         tumor(s), polyp(s), or other lesion(s) by snare                         technique Diagnosis Code(s):     --- Professional ---                        D12.2, Benign neoplasm of ascending colon  D12.8, Benign neoplasm of rectum                        D12.0, Benign neoplasm of cecum                        Z12.11, Encounter for screening for malignant neoplasm                         of colon                        K57.30, Diverticulosis of large intestine without                         perforation or abscess without bleeding CPT copyright 2022 American Medical Association. All rights reserved. The codes documented in this report are preliminary and upon coder review may  be revised to meet current compliance requirements. Dr. Libby Maw Toney Reil MD, MD 02/08/2023 8:55:39 AM This report has been signed electronically. Number of Addenda: 0 Note Initiated On: 02/08/2023 8:13 AM Scope Withdrawal Time: 0 hours 15 minutes 27 seconds  Total Procedure Duration: 0 hours 20 minutes 39 seconds  Estimated Blood Loss:  Estimated blood loss: none.      [facility]

## 2023-02-08 NOTE — Anesthesia Postprocedure Evaluation (Signed)
Anesthesia Post Note  Patient: Annette Valencia  Procedure(s) Performed: COLONOSCOPY WITH PROPOFOL  Patient location during evaluation: PACU Anesthesia Type: General Level of consciousness: awake and awake and alert Pain management: pain level controlled Vital Signs Assessment: post-procedure vital signs reviewed and stable Respiratory status: spontaneous breathing and nonlabored ventilation Cardiovascular status: stable Anesthetic complications: no   No notable events documented.   Last Vitals:  Vitals:   02/08/23 0726 02/08/23 0857  BP: 111/81 (!) 91/55  Pulse: 82   Resp: 20   Temp: (!) 36 C (!) 35.8 C  SpO2: 98%     Last Pain:  Vitals:   02/08/23 0917  TempSrc:   PainSc: 0-No pain                 VAN STAVEREN,Adrianne Shackleton

## 2023-02-08 NOTE — H&P (Signed)
Arlyss Repress, MD [address]  [address]  [place], [place] [address]  Main: [phone]  Fax: [phone] Pager: [phone]  Primary Care Physician:  Glori Luis, MD Primary Gastroenterologist:  Dr. Arlyss Repress  Pre-Procedure History & Physical: HPI:  Annette Valencia is a 57 y.o. female is here for an colonoscopy.   Past Medical History:  Diagnosis Date   Arthritis    knees   Chicken pox    Frequent headaches    Hyperlipidemia    Hypertension    Hypothyroidism    Migraines    PONV (postoperative nausea and vomiting)    Scoliosis    Sleep apnea    CPAP   UTI (urinary tract infection)     Past Surgical History:  Procedure Laterality Date   CERVICAL ABLATION  2017   CESAREAN SECTION  1988   CHOLECYSTECTOMY     COLONOSCOPY WITH PROPOFOL N/A 08/06/2017   Procedure: COLONOSCOPY WITH PROPOFOL;  Surgeon: Pasty Spillers, MD;  Location: [facility];  Service: Endoscopy;  Laterality: N/A;  please keep early   TUBAL LIGATION     tubes tied  2000    Prior to Admission medications   Medication Sig Start Date End Date Taking? Authorizing Provider  atorvastatin (LIPITOR) 40 MG tablet Take 1 tablet (40 mg total) by mouth daily. 12/14/22  Yes Glori Luis, MD  fluocinonide (LIDEX) 0.05 % external solution Apply 1 mL topically 2 (two) times daily. 05/06/18  Yes [provider]  levothyroxine (SYNTHROID) 75 MCG tablet TAKE 1 TABLET(75 MCG) BY MOUTH DAILY Patient taking differently: 137 mcg. Patient takes 137 mcg daily 09/03/19  Yes Glori Luis, MD  losartan (COZAAR) 100 MG tablet TAKE 1 TABLET(100 MG) BY MOUTH DAILY 06/02/19  Yes Glori Luis, MD  meloxicam (MOBIC) 7.5 MG tablet TAKE 1 TABLET(7.5 MG) BY MOUTH DAILY 12/14/22  Yes Glori Luis, MD  pantoprazole (PROTONIX) 40 MG tablet Take 1 tablet (40 mg total) by mouth daily. 12/18/22  Yes Glori Luis, MD    Allergies as of 12/25/2022   (No Known Allergies)     Family History  Problem Relation Age of Onset   Arthritis Mother    Cancer Mother        lung   Hyperlipidemia Mother    Alcohol abuse Father    Stroke Father    Hypertension Father    ALS Father    Hypertension Maternal Grandmother     Social History   Socioeconomic History   Marital status: Married    Spouse name: Not on file   Number of children: Not on file   Years of education: Not on file   Highest education level: Associate degree: academic program  Occupational History   Not on file  Tobacco Use   Smoking status: Former    Types: Cigarettes    Quit date: 04/18/1992    Years since quitting: 30.8   Smokeless tobacco: Never  Vaping Use   Vaping Use: Never used  Substance and Sexual Activity   Alcohol use: Yes    Alcohol/week: 10.0 standard drinks of alcohol    Types: 10 Glasses of wine per week    Comment: wine   Drug use: No   Sexual activity: Yes    Partners: Male  Other Topics Concern   Not on file  Social History Narrative   Not on file   Social Determinants of Health   Financial Resource Strain: Low Risk  (  12/10/2022)   Overall Financial Resource Strain (CARDIA)    Difficulty of Paying Living Expenses: Not very hard  Food Insecurity: No Food Insecurity (12/10/2022)   Hunger Vital Sign    Worried About Running Out of Food in the Last Year: Never true    Ran Out of Food in the Last Year: Never true  Transportation Needs: No Transportation Needs (12/10/2022)   PRAPARE - Administrator, Civil Service (Medical): No    Lack of Transportation (Non-Medical): No  Physical Activity: Insufficiently Active (12/10/2022)   Exercise Vital Sign    Days of Exercise per Week: 2 days    Minutes of Exercise per Session: 30 min  Stress: No Stress Concern Present (12/10/2022)   [employer] of Occupational Health - Occupational Stress Questionnaire    Feeling of Stress : Only a little  Social Connections: Moderately Isolated (12/10/2022)   Social  Connection and Isolation Panel [NHANES]    Frequency of Communication with Friends and Family: Twice a week    Frequency of Social Gatherings with Friends and Family: Once a week    Attends Religious Services: Never    Database administrator or Organizations: No    Attends Engineer, structural: Not on file    Marital Status: Married  Catering manager Violence: Not on file    Review of Systems: See HPI, otherwise negative ROS  Physical Exam: BP 111/81   Pulse 82   Temp (!) 96.8 F (36 C) (Temporal)   Resp 20   Ht 5\' 7"  (1.702 m)   Wt 116.3 kg   SpO2 98%   BMI 40.16 kg/m  General:   Alert,  pleasant and cooperative in NAD Head:  Normocephalic and atraumatic. Neck:  Supple; no masses or thyromegaly. Lungs:  Clear throughout to auscultation.    Heart:  Regular rate and rhythm. Abdomen:  Soft, nontender and nondistended. Normal bowel sounds, without guarding, and without rebound.   Neurologic:  Alert and  oriented x4;  grossly normal neurologically.  Impression/Plan: Annette Valencia is here for an colonoscopy to be performed for colon cancer screening  Risks, benefits, limitations, and alternatives regarding  colonoscopy have been reviewed with the patient.  Questions have been answered.  All parties agreeable.   [name], MD  02/08/2023, 7:46 AM

## 2023-02-08 NOTE — Anesthesia Procedure Notes (Signed)
Procedure Name: General with mask airway Date/Time: 02/08/2023 8:24 AM  Performed by: Lily Lovings, CRNAPre-anesthesia Checklist: Patient identified, Emergency Drugs available, Suction available, Patient being monitored and Timeout performed Patient Re-evaluated:Patient Re-evaluated prior to induction Oxygen Delivery Method: Simple face mask Preoxygenation: Pre-oxygenation with 100% oxygen Induction Type: IV induction

## 2023-02-08 NOTE — Transfer of Care (Signed)
Immediate Anesthesia Transfer of Care Note  Patient: Annette Valencia  Procedure(s) Performed: COLONOSCOPY WITH PROPOFOL  Patient Location: Endoscopy Unit  Anesthesia Type:General  Level of Consciousness: drowsy and patient cooperative  Airway & Oxygen Therapy: Patient Spontanous Breathing and Patient connected to face mask oxygen  Post-op Assessment: Report given to RN and Patient moving all extremities X 4  Post vital signs: Reviewed and stable  Last Vitals:  Vitals Value Taken Time  BP 91/55 02/08/23 0858  Temp 35.8 C 02/08/23 0857  Pulse 78 02/08/23 0858  Resp 14 02/08/23 0858  SpO2 96 % 02/08/23 0858  Vitals shown include unvalidated device data.  Last Pain:  Vitals:   02/08/23 0857  TempSrc: Temporal  PainSc: 0-No pain         Complications: No notable events documented.

## 2023-02-08 NOTE — Anesthesia Preprocedure Evaluation (Signed)
Anesthesia Evaluation  Patient identified by MRN, date of birth, ID band Patient awake    Reviewed: Allergy & Precautions, NPO status , Patient's Chart, lab work & pertinent test results  History of Anesthesia Complications (+) PONV and history of anesthetic complications  Airway Mallampati: II  TM Distance: >3 FB Neck ROM: full    Dental  (+) Teeth Intact   Pulmonary neg pulmonary ROS, sleep apnea , Patient abstained from smoking., former smoker   Pulmonary exam normal breath sounds clear to auscultation       Cardiovascular Exercise Tolerance: Good hypertension, Pt. on medications negative cardio ROS Normal cardiovascular exam Rhythm:Regular Rate:Normal     Neuro/Psych  Headaches   Depression    negative neurological ROS  negative psych ROS   GI/Hepatic negative GI ROS, Neg liver ROS,GERD  Medicated,,  Endo/Other  negative endocrine ROSHypothyroidism  Morbid obesity  Renal/GU negative Renal ROS  negative genitourinary   Musculoskeletal   Abdominal  (+) + obese  Peds negative pediatric ROS (+)  Hematology negative hematology ROS (+)   Anesthesia Other Findings Past Medical History: No date: Arthritis     Comment:  knees No date: Chicken pox No date: Frequent headaches No date: Hyperlipidemia No date: Hypertension No date: Hypothyroidism No date: Migraines No date: PONV (postoperative nausea and vomiting) No date: Scoliosis No date: Sleep apnea     Comment:  CPAP No date: UTI (urinary tract infection)  Past Surgical History: 2017: CERVICAL ABLATION 1988: CESAREAN SECTION No date: CHOLECYSTECTOMY 08/06/2017: COLONOSCOPY WITH PROPOFOL; N/A     Comment:  Procedure: COLONOSCOPY WITH PROPOFOL;  Surgeon:               Pasty Spillers, MD;  Location: [facility];              Service: Endoscopy;  Laterality: N/A;  please keep early No date: TUBAL LIGATION 2000: tubes tied  BMI    Body  Mass Index: 40.16 kg/m      Reproductive/Obstetrics negative OB ROS                             Anesthesia Physical Anesthesia Plan  ASA: 3  Anesthesia Plan: General   Post-op Pain Management:    Induction: Intravenous  PONV Risk Score and Plan: Propofol infusion and TIVA  Airway Management Planned: Natural Airway  Additional Equipment:   Intra-op Plan:   Post-operative Plan:   Informed Consent: I have reviewed the patients History and Physical, chart, labs and discussed the procedure including the risks, benefits and alternatives for the proposed anesthesia with the patient or authorized representative who has indicated his/her understanding and acceptance.     Dental Advisory Given  Plan Discussed with: CRNA and Surgeon  Anesthesia Plan Comments:        Anesthesia Quick Evaluation

## 2023-02-09 NOTE — Progress Notes (Signed)
Voicemail.  No Message Left. 

## 2023-02-11 ENCOUNTER — Encounter: Payer: Self-pay | Admitting: Family Medicine

## 2023-02-11 ENCOUNTER — Encounter: Payer: Self-pay | Admitting: Gastroenterology

## 2023-02-11 MED ORDER — LEVOTHYROXINE SODIUM 137 MCG PO TABS: 137.0000 ug | ORAL_TABLET | Freq: Every day | ORAL | 2 refills | Status: DC

## 2023-02-13 ENCOUNTER — Telehealth: Payer: Self-pay

## 2023-02-13 NOTE — Telephone Encounter (Signed)
Patient contacted office to inquire if we have received pathology results from her biopsies taken during her 02/08/23 colonoscopy.  Informed her that we have not received them yet however generally the patient will receive results in mychart even before the doctor has had the opportunity to review them.  Also informed her that she will receive a phone call or a letter in her mychart giving her the results of her pathology.  Thanks, [place]

## 2023-02-13 NOTE — Telephone Encounter (Signed)
Pt left message has questions in ref to colonoscopy please return call

## 2023-02-14 LAB — SURGICAL PATHOLOGY

## 2023-02-15 ENCOUNTER — Encounter: Payer: Self-pay | Admitting: Gastroenterology

## 2023-03-07 DIAGNOSIS — G4733 Obstructive sleep apnea (adult) (pediatric): Secondary | ICD-10-CM | POA: Diagnosis not present

## 2023-03-08 ENCOUNTER — Telehealth: Payer: Self-pay | Admitting: Family Medicine

## 2023-03-08 DIAGNOSIS — R7989 Other specified abnormal findings of blood chemistry: Secondary | ICD-10-CM

## 2023-03-08 NOTE — Telephone Encounter (Signed)
Ordered

## 2023-03-08 NOTE — Telephone Encounter (Signed)
Patient need orders  °

## 2023-03-20 ENCOUNTER — Other Ambulatory Visit (INDEPENDENT_AMBULATORY_CARE_PROVIDER_SITE_OTHER): Payer: BC Managed Care – PPO

## 2023-03-20 ENCOUNTER — Other Ambulatory Visit: Payer: Self-pay | Admitting: Family Medicine

## 2023-03-20 DIAGNOSIS — E039 Hypothyroidism, unspecified: Secondary | ICD-10-CM

## 2023-03-20 DIAGNOSIS — R7989 Other specified abnormal findings of blood chemistry: Secondary | ICD-10-CM

## 2023-03-20 LAB — HEPATIC FUNCTION PANEL
ALT: 54 U/L — ABNORMAL HIGH (ref 0–35)
AST: 42 U/L — ABNORMAL HIGH (ref 0–37)
Albumin: 4.4 g/dL (ref 3.5–5.2)
Alkaline Phosphatase: 155 U/L — ABNORMAL HIGH (ref 39–117)
Bilirubin, Direct: 0.2 mg/dL (ref 0.0–0.3)
Total Bilirubin: 0.8 mg/dL (ref 0.2–1.2)
Total Protein: 7.3 g/dL (ref 6.0–8.3)

## 2023-03-20 LAB — TSH: TSH: 0.16 u[IU]/mL — ABNORMAL LOW (ref 0.35–5.50)

## 2023-03-26 ENCOUNTER — Encounter: Payer: Self-pay | Admitting: Family Medicine

## 2023-03-26 ENCOUNTER — Other Ambulatory Visit: Payer: Self-pay

## 2023-03-26 DIAGNOSIS — E039 Hypothyroidism, unspecified: Secondary | ICD-10-CM

## 2023-03-26 MED ORDER — LEVOTHYROXINE SODIUM 125 MCG PO TABS: 125.0000 ug | ORAL_TABLET | Freq: Every day | ORAL | 1 refills | Status: DC

## 2023-03-26 NOTE — Addendum Note (Signed)
Addended by: Prince Solian A on: 03/26/2023 05:02 PM   Modules accepted: Orders

## 2023-03-27 ENCOUNTER — Telehealth: Payer: Self-pay | Admitting: Family Medicine

## 2023-03-27 NOTE — Telephone Encounter (Signed)
Message from MyChart:  I gave the nurse Miss  information,  I am taking my Lipitor .Marland Kitchen It is just a different name, atorvasatin.. I told her I was not taking it

## 2023-03-27 NOTE — Addendum Note (Signed)
Addended by: Birdie Sons, [place] Decoste G on: 03/27/2023 01:00 PM   Modules accepted: Orders

## 2023-03-27 NOTE — Telephone Encounter (Signed)
This has already been handled see results message

## 2023-04-06 ENCOUNTER — Encounter: Payer: Self-pay | Admitting: Family Medicine

## 2023-04-06 DIAGNOSIS — G4733 Obstructive sleep apnea (adult) (pediatric): Secondary | ICD-10-CM | POA: Diagnosis not present

## 2023-04-08 ENCOUNTER — Other Ambulatory Visit: Payer: Self-pay

## 2023-04-08 MED ORDER — LOSARTAN POTASSIUM 100 MG PO TABS: ORAL_TABLET | ORAL | 3 refills | Status: DC

## 2023-04-08 MED ORDER — HYDROCHLOROTHIAZIDE 25 MG PO TABS: 25.0000 mg | ORAL_TABLET | Freq: Every day | ORAL | 3 refills | Status: DC

## 2023-04-11 LAB — HM DIABETES EYE EXAM

## 2023-05-06 ENCOUNTER — Other Ambulatory Visit: Payer: Self-pay | Admitting: Family

## 2023-05-06 ENCOUNTER — Encounter: Payer: Self-pay | Admitting: Family Medicine

## 2023-05-06 MED ORDER — METFORMIN HCL ER 500 MG PO TB24: 500.0000 mg | ORAL_TABLET | Freq: Two times a day (BID) | ORAL | 3 refills | Status: DC

## 2023-05-06 NOTE — Telephone Encounter (Signed)
Pt requesting metformin but not currently on her active med list please advise

## 2023-05-07 ENCOUNTER — Other Ambulatory Visit: Payer: BC Managed Care – PPO

## 2023-05-07 DIAGNOSIS — G4733 Obstructive sleep apnea (adult) (pediatric): Secondary | ICD-10-CM | POA: Diagnosis not present

## 2023-05-08 ENCOUNTER — Other Ambulatory Visit (INDEPENDENT_AMBULATORY_CARE_PROVIDER_SITE_OTHER): Payer: BC Managed Care – PPO

## 2023-05-08 DIAGNOSIS — E039 Hypothyroidism, unspecified: Secondary | ICD-10-CM | POA: Diagnosis not present

## 2023-05-10 LAB — TSH: TSH: 0.91 u[IU]/mL (ref 0.35–5.50)

## 2023-06-12 ENCOUNTER — Other Ambulatory Visit: Payer: Self-pay | Admitting: Family Medicine

## 2023-06-12 DIAGNOSIS — K219 Gastro-esophageal reflux disease without esophagitis: Secondary | ICD-10-CM

## 2023-06-13 NOTE — Telephone Encounter (Signed)
Patient is due for follow-up. Please call her and get her scheduled. I have sent in one refill of each medication.

## 2023-07-15 ENCOUNTER — Other Ambulatory Visit: Payer: Self-pay | Admitting: Family Medicine

## 2023-07-16 DIAGNOSIS — G4733 Obstructive sleep apnea (adult) (pediatric): Secondary | ICD-10-CM | POA: Diagnosis not present

## 2023-07-16 NOTE — Telephone Encounter (Signed)
Please find out what she uses this for as I have not filled this medication for her previously.

## 2023-07-18 MED ORDER — FLUOCINONIDE 0.05 % EX SOLN: Freq: Two times a day (BID) | CUTANEOUS | 2 refills | Status: AC

## 2023-07-25 ENCOUNTER — Encounter: Payer: Self-pay | Admitting: Family Medicine

## 2023-07-25 ENCOUNTER — Ambulatory Visit: Payer: BC Managed Care – PPO | Admitting: Family Medicine

## 2023-07-25 VITALS — BP 122/80 | HR 81 | Temp 98.7°F | Ht 67.0 in | Wt 262.0 lb

## 2023-07-25 DIAGNOSIS — R051 Acute cough: Secondary | ICD-10-CM | POA: Diagnosis not present

## 2023-07-25 LAB — POCT INFLUENZA A/B
Influenza A, POC: NEGATIVE
Influenza B, POC: NEGATIVE

## 2023-07-25 LAB — POC COVID19 BINAXNOW: SARS Coronavirus 2 Ag: NEGATIVE

## 2023-07-25 MED ORDER — AZITHROMYCIN 250 MG PO TABS: ORAL_TABLET | ORAL | 0 refills | Status: AC

## 2023-07-25 NOTE — Progress Notes (Signed)
Patient ID: Annette Valencia, female    DOB: [birth year], 57 y.o.   MRN: [id number]  This visit was conducted in person.  BP 122/80 (BP Location: Left Arm, Patient Position: Sitting, Cuff Size: Normal)   Pulse 81   Temp 98.7 F (37.1 C) (Oral)   Ht 5\' 7"  (1.702 m)   Wt 262 lb (118.8 kg)   SpO2 95%   BMI 41.04 kg/m    CC:  Chief Complaint  Patient presents with   Nasal Congestion    Cough, ST, aches, and fatigue since Monday. Patient has taken 3 covid tests and all have been negative. She's been taking nyquil and dayquil for sx.     Subjective:   HPI: Annette Valencia is a 57 y.o. female patient of Dr. Birdie Valencia presenting on 07/25/2023 for Nasal Congestion (Cough, ST, aches, and fatigue since Monday. Patient has taken 3 covid tests and all have been negative. She's been taking nyquil and dayquil for sx. )   Date of onset:  4 days Initial symptoms included sore throat, body aches and fatigue, nasal congestion, yellow mucus Symptoms progressed to cough, dry   Feeling worse in last 24 hours  Low grade fever without sound like I am okay you said no  NO SOB, no wheeze.   Sick contacts: none COVID testing:   negative x 3     She has tried to treat with DayQuil and NyQuil   No history of chronic lung disease such as asthma or COPD.  Former smoker remote Has history of diabetes   She is travelling next week.  Lab Results  Component Value Date   HGBA1C 6.6 (H) 12/14/2022        Relevant past medical, surgical, family and social history reviewed and updated as indicated. Interim medical history since our last visit reviewed. Allergies and medications reviewed and updated. Outpatient Medications Prior to Visit  Medication Sig Dispense Refill   atorvastatin (LIPITOR) 40 MG tablet Take 1 tablet (40 mg total) by mouth daily. 90 tablet 3   fluocinonide (LIDEX) 0.05 % external solution Apply topically 2 (two) times daily. 60 mL 2   hydrochlorothiazide (HYDRODIURIL) 25 MG  tablet Take 1 tablet (25 mg total) by mouth daily. 90 tablet 3   levothyroxine (SYNTHROID) 125 MCG tablet Take 1 tablet (125 mcg total) by mouth daily. 90 tablet 1   losartan (COZAAR) 100 MG tablet TAKE 1 TABLET(100 MG) BY MOUTH DAILY 90 tablet 3   meloxicam (MOBIC) 7.5 MG tablet TAKE 1 TABLET(7.5 MG) BY MOUTH DAILY as needed 90 tablet 0   metFORMIN (GLUCOPHAGE-XR) 500 MG 24 hr tablet Take 1 tablet (500 mg total) by mouth 2 (two) times daily with a meal. 60 tablet 3   pantoprazole (PROTONIX) 40 MG tablet TAKE 1 TABLET(40 MG) BY MOUTH DAILY 90 tablet 0   No facility-administered medications prior to visit.     Per HPI unless specifically indicated in ROS section below Review of Systems  Constitutional:  Positive for fatigue and fever.  HENT:  Positive for congestion and sore throat. Negative for sinus pressure.   Eyes:  Negative for pain.  Respiratory:  Positive for cough. Negative for shortness of breath.   Cardiovascular:  Negative for chest pain, palpitations and leg swelling.  Gastrointestinal:  Negative for abdominal pain.  Genitourinary:  Negative for dysuria and vaginal bleeding.  Musculoskeletal:  Negative for back pain.  Neurological:  Negative for syncope, light-headedness and headaches.  Psychiatric/Behavioral:  Negative for dysphoric mood.  Objective:  BP 122/80 (BP Location: Left Arm, Patient Position: Sitting, Cuff Size: Normal)   Pulse 81   Temp 98.7 F (37.1 C) (Oral)   Ht 5\' 7"  (1.702 m)   Wt 262 lb (118.8 kg)   SpO2 95%   BMI 41.04 kg/m   Wt Readings from Last 3 Encounters:  07/25/23 262 lb (118.8 kg)  02/08/23 256 lb 6.4 oz (116.3 kg)  12/14/22 270 lb (122.5 kg)      Physical Exam Constitutional:      General: She is not in acute distress.    Appearance: She is well-developed. She is not ill-appearing or toxic-appearing.  HENT:     Head: Normocephalic.     Right Ear: Hearing, ear canal and external ear normal. A middle ear effusion is present. Tympanic  membrane is not erythematous, retracted or bulging.     Left Ear: Hearing, ear canal and external ear normal. A middle ear effusion is present. Tympanic membrane is not erythematous, retracted or bulging.     Nose: Mucosal edema and rhinorrhea present.     Right Sinus: No maxillary sinus tenderness or frontal sinus tenderness.     Left Sinus: No maxillary sinus tenderness or frontal sinus tenderness.     Mouth/Throat:     Mouth: Oropharynx is clear and moist and mucous membranes are normal.     Pharynx: Uvula midline.  Eyes:     General: Lids are normal. Lids are everted, no foreign bodies appreciated.     Extraocular Movements: EOM normal.     Conjunctiva/sclera: Conjunctivae normal.     Pupils: Pupils are equal, round, and reactive to light.  Neck:     Thyroid: No thyroid mass or thyromegaly.     Vascular: No carotid bruit.     Trachea: Trachea normal.  Cardiovascular:     Rate and Rhythm: Normal rate and regular rhythm.     Pulses: Normal pulses.     Heart sounds: Normal heart sounds, S1 normal and S2 normal. No murmur heard.    No friction rub. No gallop.  Pulmonary:     Effort: Pulmonary effort is normal. No tachypnea or respiratory distress.     Breath sounds: Normal breath sounds. No decreased breath sounds, wheezing, rhonchi or rales.  Musculoskeletal:     Cervical back: Normal range of motion and neck supple.  Skin:    General: Skin is warm, dry and intact.     Findings: No rash.  Neurological:     Mental Status: She is alert.  Psychiatric:        Mood and Affect: Mood is not anxious or depressed.        Speech: Speech normal.        Behavior: Behavior normal. Behavior is cooperative.        Cognition and Memory: Cognition and memory normal.        Judgment: Judgment normal.       Results for orders placed or performed in visit on 05/08/23  TSH  Result Value Ref Range   TSH 0.91 0.35 - 5.50 uIU/mL    Assessment and Plan  Acute cough Assessment &  Plan: Acute, likely viral upper respiratory tract infection, no current sign of bacterial infection.  Will evaluate with COVID and flu test in office today.  Recommend rest and supportive care.  If her symptoms or not improving by day 5-7 of illness she will take she antibiotics, given increased risk of infection with diabetes.  Orders: -  POC COVID-19 BinaxNow -     POCT Influenza A/B  Other orders -     Azithromycin; Take 2 tablets on day 1, then 1 tablet daily on days 2 through 5  Dispense: 6 tablet; Refill: 0    No follow-ups on file.   Kerby Nora, MD

## 2023-07-25 NOTE — Assessment & Plan Note (Signed)
Acute, likely viral upper respiratory tract infection, no current sign of bacterial infection.  Will evaluate with COVID and flu test in office today.  Recommend rest and supportive care.  If her symptoms or not improving by day 5-7 of illness she will take she antibiotics, given increased risk of infection with diabetes.

## 2023-08-15 DIAGNOSIS — G4733 Obstructive sleep apnea (adult) (pediatric): Secondary | ICD-10-CM | POA: Diagnosis not present

## 2023-08-29 ENCOUNTER — Encounter: Payer: Self-pay | Admitting: Family

## 2023-08-29 ENCOUNTER — Ambulatory Visit: Payer: BC Managed Care – PPO | Admitting: Family

## 2023-08-29 VITALS — BP 124/80 | HR 78 | Ht 67.0 in | Wt 260.0 lb

## 2023-08-29 DIAGNOSIS — H02846 Edema of left eye, unspecified eyelid: Secondary | ICD-10-CM | POA: Insufficient documentation

## 2023-08-29 MED ORDER — ERYTHROMYCIN 5 MG/GM OP OINT: TOPICAL_OINTMENT | OPHTHALMIC | 1 refills | Status: DC

## 2023-08-29 NOTE — Progress Notes (Signed)
Assessment & Plan:  Swelling of eyelid, left Assessment & Plan: Nontender left eyelid.  Afebrile and nontoxic in appearance question if early stye.  Presentation not consistent with orbital cellulitis.  Counseled on symptoms which warrant prompt reevaluation.  Discussed she may also use cool compresses, antihistamine.  I did provide patient with erythromycin ointment.  She will let me know how she is doing  Orders: -     Erythromycin; Use one half inch four times daily to affected eye (s) x 7 days.  Dispense: 3.5 g; Refill: 1     Return precautions given.   Risks, benefits, and alternatives of the medications and treatment plan prescribed today were discussed, and patient expressed understanding.   Education regarding symptom management and diagnosis given to patient on AVS either electronically or printed.  No follow-ups on file.  Rennie Plowman, FNP  Subjective:    Patient ID: Annette Valencia, female    DOB: 08-11-1966, 57 y.o.   MRN: [id number]  CC: Annette Valencia is a 57 y.o. female who presents today for an acute visit.    HPI: Complains of left eyelid swelling, started yesterday.   She has noted some 'crusty' from left eye.   No eye pain, vision changes.   No new makeup , eye cream   She had similar episode on left lower lid one year ago.   She is using exfoliate face wash  She wears glasses.   Wearing Cipap however good seal. She is cleaning Cipap.       History of hypertension, OSA, GERD, hypothyroidism   Allergies: Patient has no known allergies. Current Outpatient Medications on File Prior to Visit  Medication Sig Dispense Refill   atorvastatin (LIPITOR) 40 MG tablet Take 1 tablet (40 mg total) by mouth daily. 90 tablet 3   fluocinonide (LIDEX) 0.05 % external solution Apply topically 2 (two) times daily. (Patient taking differently: Apply topically 2 (two) times daily as needed.) 60 mL 2   hydrochlorothiazide (HYDRODIURIL) 25 MG tablet Take 1  tablet (25 mg total) by mouth daily. 90 tablet 3   levothyroxine (SYNTHROID) 125 MCG tablet Take 1 tablet (125 mcg total) by mouth daily. 90 tablet 1   losartan (COZAAR) 100 MG tablet TAKE 1 TABLET(100 MG) BY MOUTH DAILY 90 tablet 3   meloxicam (MOBIC) 7.5 MG tablet TAKE 1 TABLET(7.5 MG) BY MOUTH DAILY as needed (Patient taking differently: Take 7.5 mg by mouth daily. TAKE 1 TABLET(7.5 MG) BY MOUTH DAILY) 90 tablet 0   metFORMIN (GLUCOPHAGE-XR) 500 MG 24 hr tablet Take 1 tablet (500 mg total) by mouth 2 (two) times daily with a meal. 60 tablet 3   Multiple Vitamins-Minerals (MULTIVITAMIN WOMEN PO) Take by mouth.     pantoprazole (PROTONIX) 40 MG tablet TAKE 1 TABLET(40 MG) BY MOUTH DAILY 90 tablet 0   VITAMIN D PO Take by mouth daily.     No current facility-administered medications on file prior to visit.    Review of Systems  Constitutional:  Negative for chills and fever.  HENT:  Negative for congestion.   Eyes:  Positive for redness. Negative for photophobia, pain, discharge, itching and visual disturbance.  Respiratory:  Negative for cough.   Cardiovascular:  Negative for chest pain and palpitations.  Gastrointestinal:  Negative for nausea and vomiting.      Objective:    BP 124/80 (BP Location: Right Arm, Patient Position: Sitting, Cuff Size: Large)   Pulse 78   Ht 5\' 7"  (1.702 m)  Wt 260 lb (117.9 kg)   SpO2 95%   BMI 40.72 kg/m   BP Readings from Last 3 Encounters:  08/29/23 124/80  07/25/23 122/80  02/08/23 (!) 91/55   Wt Readings from Last 3 Encounters:  08/29/23 260 lb (117.9 kg)  07/25/23 262 lb (118.8 kg)  02/08/23 256 lb 6.4 oz (116.3 kg)      Physical Exam Vitals reviewed.  Constitutional:      Appearance: She is well-developed.  HENT:     Head: Normocephalic and atraumatic.     Right Ear: Hearing, tympanic membrane, ear canal and external ear normal. No decreased hearing noted. No drainage, swelling or tenderness. No middle ear effusion. No foreign  body. Tympanic membrane is not erythematous or bulging.     Left Ear: Hearing, tympanic membrane, ear canal and external ear normal. No decreased hearing noted. No drainage, swelling or tenderness.  No middle ear effusion. No foreign body. Tympanic membrane is not erythematous or bulging.     Nose: No rhinorrhea.     Right Sinus: No maxillary sinus tenderness or frontal sinus tenderness.     Left Sinus: No maxillary sinus tenderness or frontal sinus tenderness.     Mouth/Throat:     Pharynx: Uvula midline. No oropharyngeal exudate or posterior oropharyngeal erythema.     Tonsils: No tonsillar abscesses.  Eyes:     General: Lids are everted, no foreign bodies appreciated. No scleral icterus.       Right eye: No discharge or hordeolum.        Left eye: No discharge or hordeolum.     Conjunctiva/sclera:     Right eye: Right conjunctiva is not injected. No hemorrhage.    Left eye: Left conjunctiva is not injected. No hemorrhage.    Pupils: Pupils are equal, round, and reactive to light.     Comments: No external eye lesions. Surrounding skin intact.   Left upper eye lid diffuse erythema, nontender.  No increased warmth, palpable nodule.Skin is not boggy. Able to able and close eye normally.   Left  eye:   No injection of the conjunctiva. No white spots, opacity, or foreign body appreciated. No collection of blood or pus in the anterior chamber. No ciliary flush surrounding iris.   No photophobia or eye pain appreciated during exam.   Cardiovascular:     Rate and Rhythm: Regular rhythm.     Pulses: Normal pulses.     Heart sounds: Normal heart sounds.  Pulmonary:     Effort: Pulmonary effort is normal.     Breath sounds: Normal breath sounds. No wheezing, rhonchi or rales.  Lymphadenopathy:     Head:     Right side of head: No submental, submandibular, tonsillar, preauricular, posterior auricular or occipital adenopathy.     Left side of head: No submental, submandibular, tonsillar,  preauricular, posterior auricular or occipital adenopathy.     Cervical: No cervical adenopathy.  Skin:    General: Skin is warm and dry.  Neurological:     Mental Status: She is alert.  Psychiatric:        Speech: Speech normal.        Behavior: Behavior normal.        Thought Content: Thought content normal.

## 2023-08-29 NOTE — Patient Instructions (Signed)
You may use cool compresses to reduce swelling of the left eye.  Erythromycin ointment provided to treat for bacterial infection.  Certainly if redness or swelling were to worsen, as discussed, recommend reevaluation to ensure infection is not spreading.

## 2023-08-29 NOTE — Assessment & Plan Note (Addendum)
Nontender left eyelid.  Afebrile and nontoxic in appearance question if early stye.  Presentation not consistent with orbital cellulitis.  Counseled on symptoms which warrant prompt reevaluation.  Discussed she may also use cool compresses, antihistamine.  I did provide patient with erythromycin ointment.  She will let me know how she is doing

## 2023-08-31 ENCOUNTER — Other Ambulatory Visit: Payer: Self-pay | Admitting: Family Medicine

## 2023-08-31 ENCOUNTER — Other Ambulatory Visit: Payer: Self-pay | Admitting: Family

## 2023-08-31 DIAGNOSIS — K219 Gastro-esophageal reflux disease without esophagitis: Secondary | ICD-10-CM

## 2023-09-02 ENCOUNTER — Telehealth: Payer: BC Managed Care – PPO | Admitting: Family

## 2023-09-02 ENCOUNTER — Telehealth: Payer: Self-pay

## 2023-09-02 DIAGNOSIS — R600 Localized edema: Secondary | ICD-10-CM

## 2023-09-02 MED ORDER — METHYLPREDNISOLONE 4 MG PO TBPK: ORAL_TABLET | ORAL | 0 refills | Status: DC

## 2023-09-02 NOTE — Progress Notes (Signed)
Virtual Visit via Video   I connected with patient on 09/02/23 at  1:40 PM EST by a video enabled telemedicine application and verified that I am speaking with the correct person using two identifiers.  Location patient: Home Location provider: [employer] [facility], Office Persons participating in the virtual visit: Patient, Provider, CMA  I discussed the limitations of evaluation and management by telemedicine and the availability of in person appointments. The patient expressed understanding and agreed to proceed.  Subjective:   HPI:   58 year old female presents today with c/o swelling around her left eye and itching. She is currently being treated for conjunctivitis with erythromycin ointment. She does not have much redness to her eye anymore. It is slightly pink and itchy.   ROS:   See pertinent positives and negatives per HPI.  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/04/2018   DOE (dyspnea on exertion) 04/30/2018   Elevated LFTs 11/20/2017   Obesity 11/20/2017   Hemorrhoids 07/01/2017   Weight gain 07/01/2017   Hair loss 11/22/2015   Depression 11/22/2015   Routine general medical examination at a health care facility 04/19/2015   Menorrhagia 04/19/2015   Essential hypertension 04/19/2015   Hyperlipidemia 04/19/2015   Hypothyroidism 04/19/2015   Knee osteoarthritis 04/19/2015    Social History   Tobacco Use   Smoking status: Former    Current packs/day: 0.00    Types: Cigarettes    Quit date: 04/18/1992    Years since quitting: 31.3   Smokeless tobacco: Never  Substance Use Topics   Alcohol use: Yes    Alcohol/week: 10.0 standard drinks of alcohol    Types: 10 Glasses of wine per week    Comment: wine    Current  Outpatient Medications:    atorvastatin (LIPITOR) 40 MG tablet, Take 1 tablet (40 mg total) by mouth daily., Disp: 90 tablet, Rfl: 3   erythromycin ophthalmic ointment, Use one half inch four times daily to affected eye (s) x 7 days., Disp: 3.5 g, Rfl: 1   fluocinonide (LIDEX) 0.05 % external solution, Apply topically 2 (two) times daily. (Patient taking differently: Apply topically 2 (two) times daily as needed.), Disp: 60 mL, Rfl: 2   hydrochlorothiazide (HYDRODIURIL) 25 MG tablet, Take 1 tablet (25 mg total) by mouth daily., Disp: 90 tablet, Rfl: 3   levothyroxine (SYNTHROID) 125 MCG tablet, Take 1 tablet (125 mcg total) by mouth daily., Disp: 90 tablet, Rfl: 1   losartan (COZAAR) 100 MG tablet, TAKE 1 TABLET(100 MG) BY MOUTH DAILY, Disp: 90 tablet, Rfl: 3   meloxicam (MOBIC) 7.5 MG tablet, TAKE 1 TABLET(7.5 MG) BY MOUTH DAILY as needed (Patient taking differently: Take 7.5 mg by mouth daily. TAKE 1 TABLET(7.5 MG) BY MOUTH DAILY), Disp: 90 tablet, Rfl: 0   metFORMIN (GLUCOPHAGE-XR) 500 MG 24 hr tablet, Take 1 tablet (500 mg total) by mouth 2 (two) times daily with a meal., Disp: 60 tablet, Rfl: 3   Multiple Vitamins-Minerals (MULTIVITAMIN WOMEN PO), Take by mouth., Disp: , Rfl:    pantoprazole (PROTONIX) 40 MG tablet, TAKE 1 TABLET(40 MG) BY MOUTH DAILY, Disp: 90 tablet, Rfl: 0   VITAMIN D PO, Take by mouth daily., Disp: , Rfl:    methylPREDNISolone (MEDROL DOSEPAK) 4 MG TBPK tablet, As needed,  Disp: 21 tablet, Rfl: 0  No Known Allergies  Objective:   There were no vitals taken for this visit.  Patient is well-developed, well-nourished in no acute distress.  Resting comfortably at home.  Head is normocephalic, atraumatic.  No labored breathing.  Speech is clear and coherent with logical content.  Patient is alert and oriented at baseline.    Assessment and Plan:    Annette Valencia was seen today for eye problem.  Diagnoses and all orders for this visit:  Periorbital edema of left  eye  Other orders -     methylPREDNISolone (MEDROL DOSEPAK) 4 MG TBPK tablet; As needed   Call the office if symptoms worsen or persist. Recheck as scheduled and sooner as needed.    Eulis Foster, FNP 09/02/2023

## 2023-09-02 NOTE — Telephone Encounter (Signed)
Copied from CRM [id number]. Topic: Clinical - Medical Advice >> Sep 02, 2023  9:32 AM Annette Valencia wrote: Reason for CRM: Patient called in stated she was seen by Claris Che last week and the eye infection is getting worst, patient would like to know if she could get prescribed something for the eye infection. Patient would also like to receive a callback

## 2023-09-02 NOTE — Telephone Encounter (Signed)
Noted  

## 2023-09-02 NOTE — Telephone Encounter (Signed)
Spoke to pt and scheduled her a [facility]  to see Padonda today 09/02/23

## 2023-09-06 ENCOUNTER — Encounter: Payer: BC Managed Care – PPO | Admitting: Family Medicine

## 2023-09-07 ENCOUNTER — Encounter: Payer: Self-pay | Admitting: Family Medicine

## 2023-09-09 MED ORDER — METFORMIN HCL ER 500 MG PO TB24: 500.0000 mg | ORAL_TABLET | Freq: Two times a day (BID) | ORAL | 3 refills | Status: DC

## 2023-09-15 DIAGNOSIS — G4733 Obstructive sleep apnea (adult) (pediatric): Secondary | ICD-10-CM | POA: Diagnosis not present

## 2023-10-10 ENCOUNTER — Encounter: Payer: Self-pay | Admitting: Family Medicine

## 2023-10-11 ENCOUNTER — Other Ambulatory Visit: Payer: Self-pay

## 2023-10-11 MED ORDER — LEVOTHYROXINE SODIUM 125 MCG PO TABS: 125.0000 ug | ORAL_TABLET | Freq: Every day | ORAL | 1 refills | Status: DC

## 2023-11-14 ENCOUNTER — Encounter: Payer: BC Managed Care – PPO | Admitting: Family Medicine

## 2023-11-26 ENCOUNTER — Other Ambulatory Visit: Payer: Self-pay

## 2023-11-26 DIAGNOSIS — K219 Gastro-esophageal reflux disease without esophagitis: Secondary | ICD-10-CM

## 2023-11-26 MED ORDER — PANTOPRAZOLE SODIUM 40 MG PO TBEC: 40.0000 mg | DELAYED_RELEASE_TABLET | Freq: Every day | ORAL | 0 refills | Status: DC

## 2023-11-26 MED ORDER — MELOXICAM 7.5 MG PO TABS: ORAL_TABLET | ORAL | 0 refills | Status: DC

## 2023-12-05 NOTE — Progress Notes (Addendum)
 Established Patient Office Visit   Subjective  Patient ID: December Hedtke, female    DOB: Aug 15, 1966  Age: 58 y.o. MRN: [id number]  Chief Complaint  Patient presents with   Transitions Of Care    She  has a past medical history of Arthritis, Chicken pox, Frequent headaches, Hyperlipidemia, Hypertension, Hypothyroidism, Migraines, PONV (postoperative nausea and vomiting), Scoliosis, Sleep apnea, and UTI (urinary tract infection).  HPI HTN: On hydrochlorothiazide 25 mg, Losartan 100 mg daily. Checks home BP, ranging in 120/80 mmHg. She denies headache, chest pain. She gets b/l lower leg edema when she consumes more salty food which resolves after cutting down salty food.  Immunization: Would like to discuss updating Tdap immunization.  Update immunization on Tdap: Last Tdap 2016. She is expecting new granddaughter in April.   Elevated Transaminases: Patient drinks about 2 drinks of wine per day/14 drinks per week.  She walks 2-3 times per day. During summer months she likes swimming. She takes multivitamin no other over-the-counter supplements. She had negative workup for hepatitis A, B, C and autoimmune hepatitis in September 2019.  She was recommended to update immunization for hepatitis A and B.  05/06/2018 she had right upper quadrant ultrasound which showed fatty infiltrative changes.    Hypothyroidism: Takes levothyroxine 125 mcg. No complaints of constipation. She gets dry skin and uses lotion as needed.  Type II DM: On metformin 500 mg, twice a day.  No diarrhea. Patient reports she has cut down on daily sugar intake, eats home cooked meals.   Hyperlipidemia: Takes Atorvastatin 40 mg. Patient is concerned elevated liver transaminases could be secondary to use of statin.   R knee pain, h/o scoliosis: Takes Meloxicam 7.5 mg daily which helps with knee and back pain.  Has not seen Ortho or physical therapy for this.  Patient has a h/o OSA on CPAP.  She has not been to a sleep clinic  in about 4 years.   She uses Lidex once a year for skin breakout mostly on her scalp.  Left thigh: single lesion. It is getting bigger. Not itchy. IT flakes off.   ROS As per HPI    Objective:     BP 126/74   Pulse 72   Temp 97.8 F (36.6 C) (Oral)   Ht 5\' 7"  (1.702 m)   Wt 258 lb 12.8 oz (117.4 kg)   SpO2 96%   BMI 40.53 kg/m    Physical Exam Vitals reviewed.  Constitutional:      Appearance: Normal appearance.  HENT:     Head: Normocephalic and atraumatic.     Mouth/Throat:     Mouth: Mucous membranes are moist.  Eyes:     Conjunctiva/sclera: Conjunctivae normal.     Pupils: Pupils are equal, round, and reactive to light.  Neck:     Thyroid: No thyroid mass or thyroid tenderness.  Cardiovascular:     Rate and Rhythm: Normal rate and regular rhythm.  Pulmonary:     Effort: Pulmonary effort is normal.     Breath sounds: Normal breath sounds.  Abdominal:     General: Bowel sounds are normal.     Palpations: Abdomen is soft.     Tenderness: There is no abdominal tenderness.  Musculoskeletal:     Cervical back: Neck supple. No rigidity.     Right lower leg: No edema.     Left lower leg: No edema.  Skin:    General: Skin is warm.     Comments: Left inner thigh:  Single, hyperkeratotic lesion measuring about 0.7 mm in diameter with scaly, thick top.  Neurological:     Mental Status: She is alert and oriented to person, place, and time.  Psychiatric:        Mood and Affect: Mood normal.        Behavior: Behavior normal.     No results found for any visits on 12/09/23.  The 10-year ASCVD risk score (Arnett DK, et al., 2019) is: 6.5%    Assessment & Plan:  Type 2 diabetes mellitus with hyperglycemia, without long-term current use of insulin (HCC) -     metFORMIN HCl ER; Take 1 tablet (500 mg total) by mouth 2 (two) times daily with a meal.  Dispense: 60 tablet; Refill: 3 -     Microalbumin / creatinine urine ratio -     Hemoglobin A1c -     Basic metabolic  panel with GFR  Asymptomatic age-related postmenopausal state  Acquired hypothyroidism Assessment & Plan: Symptoms stable on levothyroxine 125 mcg daily.  Continue.  Will obtain TSH.  Patient will be updated on lab results.  Orders: -     Levothyroxine Sodium; Take 1 tablet (125 mcg total) by mouth daily.  Dispense: 90 tablet; Refill: 1 -     TSH  Elevated liver transaminase level -     Hepatic function panel -     CBC  Gastroesophageal reflux disease without esophagitis Assessment & Plan: Currently on pantoprazole 40 mg daily.  Medication side effect including risk of osteoporosis, renal damage with chronic use was discussed with the patient.  Patient will try to taper pantoprazole to every other day.  If develops symptoms on every other day use of pantoprazole she will is recommended to continue taking it every day.  Will update BMP today.  Orders: -     Pantoprazole Sodium; Take 1 tablet (40 mg total) by mouth daily.  Dispense: 90 tablet; Refill: 0  Screening for deficiency anemia  Mixed hyperlipidemia Assessment & Plan: Will obtain lipid panel.  Patient counseled on continuing taking atorvastatin 40 mg daily.  Counseled on continuing lifestyle modification including regular moderate intensity exercise, cutting down on alcohol beverage intake.  Orders: -     Atorvastatin Calcium; Take 1 tablet (40 mg total) by mouth daily.  Dispense: 90 tablet; Refill: 3 -     Lipid panel  Skin lesion of left leg -     Ambulatory referral to Dermatology  Need for hepatitis A and B vaccination -     Hepatitis A hepatitis B combined vaccine IM  Primary osteoarthritis of right knee Assessment & Plan: Symptoms stable on meloxicam 7.5 mg daily.  Discussed physical therapy, Ortho referral if symptoms are not to be well-controlled on current medication.  Side effects related to meloxicam including renal damage discussed.   Weight gain Assessment & Plan: Briefly discussed pharmacological  intervention for weight loss.  Patient has tried Ozempic in the past which helped with weight loss but insurance stopped covering for it.  Continue lifestyle modification.  Follow-up appointment in 1 month to discuss   Elevated LFTs Assessment & Plan: Recommend updating hepatic panel, CBC today.  Counseled on updating immunization for hepatitis A, B virus.  Patient agreeable.  Side effect related to immunization discussed.  First dose of Twinrix will be today, recommend second dose in 1 month and third dose in 6 months.   Class 3 severe obesity with serious comorbidity and body mass index (BMI) of 40.0 to 44.9 in  adult, unspecified obesity type [facility]) Assessment & Plan: Lifestyle and pharmacological intervention discussed today. F/U in 1 M to discuss potential pharmacological intervention.    Seborrheic keratoses, inflamed Assessment & Plan: Dermatology referral made.  Orders: -     Ambulatory referral to Dermatology  Need for immunization against viral hepatitis  Essential hypertension Assessment & Plan: Blood pressure under control.  Continue losartan 100 mg and hydrochlorothiazide 25 mg daily.  Will obtain BMP, urine microalbumin level today.  Discussed cutting down on daily salt intake.   Orders: -     hydroCHLOROthiazide; Take 1 tablet (25 mg total) by mouth daily.  Dispense: 90 tablet; Refill: 3 -     Losartan Potassium; TAKE 1 TABLET(100 MG) BY MOUTH DAILY  Dispense: 90 tablet; Refill: 3 -     Microalbumin / creatinine urine ratio -     Basic metabolic panel with GFR  Sleep apnea in adult Assessment & Plan: Patient uses CPAP.  Has not seen sleep clinic or rechecked CPAP parameters for about 4 to 5 years.  Continue using CPAP machine.  Referral to sleep clinic made today for reevaluation of CPAP machine, potential need for CPAP titration.   Orders: -     Cpap titration; Future    Return in about 4 weeks (around 01/06/2024) for PAP and second dose of Twinrix.  Consider  sleep clinic referral, obesity pharm intervention.Skip Estimable, MD

## 2023-12-09 ENCOUNTER — Ambulatory Visit: Payer: Self-pay

## 2023-12-09 VITALS — BP 126/74 | HR 72 | Temp 97.8°F | Ht 67.0 in | Wt 258.8 lb

## 2023-12-09 DIAGNOSIS — Z780 Asymptomatic menopausal state: Secondary | ICD-10-CM

## 2023-12-09 DIAGNOSIS — I10 Essential (primary) hypertension: Secondary | ICD-10-CM

## 2023-12-09 DIAGNOSIS — K219 Gastro-esophageal reflux disease without esophagitis: Secondary | ICD-10-CM

## 2023-12-09 DIAGNOSIS — L820 Inflamed seborrheic keratosis: Secondary | ICD-10-CM | POA: Insufficient documentation

## 2023-12-09 DIAGNOSIS — E66813 Obesity, class 3: Secondary | ICD-10-CM

## 2023-12-09 DIAGNOSIS — Z23 Encounter for immunization: Secondary | ICD-10-CM

## 2023-12-09 DIAGNOSIS — Z130 Encounter for screening for diseases of the blood and blood-forming organs and certain disorders involving the immune mechanism: Secondary | ICD-10-CM

## 2023-12-09 DIAGNOSIS — G4730 Sleep apnea, unspecified: Secondary | ICD-10-CM | POA: Insufficient documentation

## 2023-12-09 DIAGNOSIS — E1165 Type 2 diabetes mellitus with hyperglycemia: Secondary | ICD-10-CM

## 2023-12-09 DIAGNOSIS — Z7984 Long term (current) use of oral hypoglycemic drugs: Secondary | ICD-10-CM | POA: Diagnosis not present

## 2023-12-09 DIAGNOSIS — E782 Mixed hyperlipidemia: Secondary | ICD-10-CM

## 2023-12-09 DIAGNOSIS — R7401 Elevation of levels of liver transaminase levels: Secondary | ICD-10-CM | POA: Diagnosis not present

## 2023-12-09 DIAGNOSIS — R635 Abnormal weight gain: Secondary | ICD-10-CM

## 2023-12-09 DIAGNOSIS — L989 Disorder of the skin and subcutaneous tissue, unspecified: Secondary | ICD-10-CM

## 2023-12-09 DIAGNOSIS — R7989 Other specified abnormal findings of blood chemistry: Secondary | ICD-10-CM

## 2023-12-09 DIAGNOSIS — E039 Hypothyroidism, unspecified: Secondary | ICD-10-CM

## 2023-12-09 DIAGNOSIS — Z6841 Body Mass Index (BMI) 40.0 and over, adult: Secondary | ICD-10-CM

## 2023-12-09 DIAGNOSIS — M1711 Unilateral primary osteoarthritis, right knee: Secondary | ICD-10-CM

## 2023-12-09 LAB — CBC
HCT: 41.9 % (ref 36.0–46.0)
Hemoglobin: 14.2 g/dL (ref 12.0–15.0)
MCHC: 34 g/dL (ref 30.0–36.0)
MCV: 90.9 fl (ref 78.0–100.0)
Platelets: 197 10*3/uL (ref 150.0–400.0)
RBC: 4.6 Mil/uL (ref 3.87–5.11)
RDW: 13.6 % (ref 11.5–15.5)
WBC: 7.6 10*3/uL (ref 4.0–10.5)

## 2023-12-09 LAB — LIPID PANEL
Cholesterol: 181 mg/dL (ref 0–200)
HDL: 69.9 mg/dL (ref >39.00)
LDL Cholesterol: 88 mg/dL (ref 0–99)
NonHDL: 111.17
Total CHOL/HDL Ratio: 3
Triglycerides: 116 mg/dL (ref 0.0–149.0)
VLDL: 23.2 mg/dL (ref 0.0–40.0)

## 2023-12-09 LAB — MICROALBUMIN / CREATININE URINE RATIO
Creatinine,U: 102.4 mg/dL
Microalb Creat Ratio: UNDETERMINED mg/g (ref 0.0–30.0)
Microalb, Ur: <0.7 mg/dL

## 2023-12-09 LAB — BASIC METABOLIC PANEL WITH GFR
BUN: 22 mg/dL (ref 6–23)
CO2: 31 meq/L (ref 19–32)
Calcium: 10.3 mg/dL (ref 8.4–10.5)
Chloride: 102 meq/L (ref 96–112)
Creatinine, Ser: 0.86 mg/dL (ref 0.40–1.20)
GFR: 74.23 mL/min (ref >60.00)
Glucose, Bld: 122 mg/dL — ABNORMAL HIGH (ref 70–99)
Potassium: 3.6 meq/L (ref 3.5–5.1)
Sodium: 143 meq/L (ref 135–145)

## 2023-12-09 LAB — HEMOGLOBIN A1C: Hgb A1c MFr Bld: 6.3 % (ref 4.6–6.5)

## 2023-12-09 LAB — HEPATIC FUNCTION PANEL
ALT: 52 U/L — ABNORMAL HIGH (ref 0–35)
AST: 40 U/L — ABNORMAL HIGH (ref 0–37)
Albumin: 4.5 g/dL (ref 3.5–5.2)
Alkaline Phosphatase: 129 U/L — ABNORMAL HIGH (ref 39–117)
Bilirubin, Direct: 0.1 mg/dL (ref 0.0–0.3)
Total Bilirubin: 0.9 mg/dL (ref 0.2–1.2)
Total Protein: 7.6 g/dL (ref 6.0–8.3)

## 2023-12-09 LAB — TSH: TSH: 0.45 u[IU]/mL (ref 0.35–5.50)

## 2023-12-09 MED ORDER — ATORVASTATIN CALCIUM 40 MG PO TABS: 40.0000 mg | ORAL_TABLET | Freq: Every day | ORAL | 3 refills | Status: AC

## 2023-12-09 MED ORDER — METFORMIN HCL ER 500 MG PO TB24: 500.0000 mg | ORAL_TABLET | Freq: Two times a day (BID) | ORAL | 3 refills | Status: DC

## 2023-12-09 MED ORDER — LEVOTHYROXINE SODIUM 125 MCG PO TABS: 125.0000 ug | ORAL_TABLET | Freq: Every day | ORAL | 1 refills | Status: DC

## 2023-12-09 MED ORDER — PANTOPRAZOLE SODIUM 40 MG PO TBEC: 40.0000 mg | DELAYED_RELEASE_TABLET | Freq: Every day | ORAL | 0 refills | Status: DC

## 2023-12-09 MED ORDER — LOSARTAN POTASSIUM 100 MG PO TABS
ORAL_TABLET | ORAL | 3 refills | Status: AC
Start: 2023-12-09 — End: ?

## 2023-12-09 MED ORDER — HYDROCHLOROTHIAZIDE 25 MG PO TABS: 25.0000 mg | ORAL_TABLET | Freq: Every day | ORAL | 3 refills | Status: AC

## 2023-12-09 NOTE — Addendum Note (Signed)
 Addended by: Skip Estimable on: 12/09/2023 12:08 PM   Modules accepted: Level of Service

## 2023-12-09 NOTE — Assessment & Plan Note (Signed)
 Symptoms stable on meloxicam 7.5 mg daily.  Discussed physical therapy, Ortho referral if symptoms are not to be well-controlled on current medication.  Side effects related to meloxicam including renal damage discussed.

## 2023-12-09 NOTE — Assessment & Plan Note (Addendum)
 Patient uses CPAP.  Has not seen sleep clinic or rechecked CPAP parameters for about 4 to 5 years.  Continue using CPAP machine.  Referral to sleep clinic made today for reevaluation of CPAP machine, potential need for CPAP titration.

## 2023-12-09 NOTE — Assessment & Plan Note (Signed)
 Blood pressure under control.  Continue losartan 100 mg and hydrochlorothiazide 25 mg daily.  Will obtain BMP, urine microalbumin level today.  Discussed cutting down on daily salt intake.

## 2023-12-09 NOTE — Assessment & Plan Note (Signed)
 Briefly discussed pharmacological intervention for weight loss.  Patient has tried Ozempic in the past which helped with weight loss but insurance stopped covering for it.  Continue lifestyle modification.  Follow-up appointment in 1 month to discuss

## 2023-12-09 NOTE — Assessment & Plan Note (Signed)
 Will obtain lipid panel.  Patient counseled on continuing taking atorvastatin 40 mg daily.  Counseled on continuing lifestyle modification including regular moderate intensity exercise, cutting down on alcohol beverage intake.

## 2023-12-09 NOTE — Assessment & Plan Note (Signed)
Dermatology referral made.

## 2023-12-09 NOTE — Assessment & Plan Note (Signed)
 Recommend updating hepatic panel, CBC today.  Counseled on updating immunization for hepatitis A, B virus.  Patient agreeable.  Side effect related to immunization discussed.  First dose of Twinrix will be today, recommend second dose in 1 month and third dose in 6 months.

## 2023-12-09 NOTE — Assessment & Plan Note (Signed)
 Lifestyle and pharmacological intervention discussed today. F/U in 1 M to discuss potential pharmacological intervention.

## 2023-12-09 NOTE — Assessment & Plan Note (Signed)
 Currently on pantoprazole 40 mg daily.  Medication side effect including risk of osteoporosis, renal damage with chronic use was discussed with the patient.  Patient will try to taper pantoprazole to every other day.  If develops symptoms on every other day use of pantoprazole she will is recommended to continue taking it every day.  Will update BMP today.

## 2023-12-09 NOTE — Assessment & Plan Note (Signed)
 Symptoms stable on levothyroxine 125 mcg daily.  Continue.  Will obtain TSH.  Patient will be updated on lab results.

## 2023-12-09 NOTE — Addendum Note (Signed)
 Addended by: Skip Estimable on: 12/09/2023 10:45 AM   Modules accepted: Orders

## 2024-01-06 ENCOUNTER — Other Ambulatory Visit (HOSPITAL_COMMUNITY)
Admission: RE | Admit: 2024-01-06 | Discharge: 2024-01-06 | Disposition: A | Discharge status: Home / Self Care | Source: Ambulatory Visit

## 2024-01-06 ENCOUNTER — Ambulatory Visit

## 2024-01-06 ENCOUNTER — Ambulatory Visit (INDEPENDENT_AMBULATORY_CARE_PROVIDER_SITE_OTHER)

## 2024-01-06 VITALS — BP 112/82 | HR 79 | Temp 98.2°F | Ht 67.0 in | Wt 256.8 lb

## 2024-01-06 DIAGNOSIS — Z124 Encounter for screening for malignant neoplasm of cervix: Secondary | ICD-10-CM | POA: Diagnosis not present

## 2024-01-06 DIAGNOSIS — K644 Residual hemorrhoidal skin tags: Secondary | ICD-10-CM | POA: Diagnosis not present

## 2024-01-06 DIAGNOSIS — Z1151 Encounter for screening for human papillomavirus (HPV): Secondary | ICD-10-CM | POA: Insufficient documentation

## 2024-01-06 DIAGNOSIS — Z23 Encounter for immunization: Secondary | ICD-10-CM | POA: Insufficient documentation

## 2024-01-06 DIAGNOSIS — E66813 Obesity, class 3: Secondary | ICD-10-CM | POA: Diagnosis not present

## 2024-01-06 DIAGNOSIS — R7989 Other specified abnormal findings of blood chemistry: Secondary | ICD-10-CM

## 2024-01-06 DIAGNOSIS — Z6841 Body Mass Index (BMI) 40.0 and over, adult: Secondary | ICD-10-CM

## 2024-01-06 MED ORDER — NALTREXONE-BUPROPION HCL ER 8-90 MG PO TB12: ORAL_TABLET | ORAL | 1 refills | Status: DC

## 2024-01-06 NOTE — Assessment & Plan Note (Signed)
 Colonoscopy up-to-date. Had been bleeding intermittently. Referral to general surgery done today for further evaluation and management. Continue high fiber diet, increase daily water  intake, exercise, reduce straining.

## 2024-01-06 NOTE — Assessment & Plan Note (Signed)
 Twinrix first dose: First dose was done on 12/09/23. No side effects.  Twinrix second dose done today. Recommend third dose in 6 months, after 07/07/24.

## 2024-01-06 NOTE — Assessment & Plan Note (Signed)
 PAP/HPV sample obtained today. Patient tolerated the procedure well. We will update her on results. If both normal, repeat PAP in 5 years. If wither abnormal we will make recommendations accordingly.

## 2024-01-06 NOTE — Assessment & Plan Note (Addendum)
 Weight today: 256 lb 12.8 oz (116.5 kg)   Lifestyle and pharmacological intervention discussed today.   There is a concern that patient's mom had thyroid  disorder( unknown and unsure if she had thyroid  cancer). Discussed against starting patient on GLP-1 given increased risk of medullary thyroid  cancer.   Recommend starting patient on Contrave, s/e dicussed.  - Dosing as following: Naltrexone-buPROPion HCl ER 8-90 Mg Start 1 tablet every morning for 7 days, then 1 tablet twice daily for 7 days,  then 2 tablets every morning and one in the evening. Can go up to 2 tablets in the morning and 2 tablets in the evening if patient tolerates the medication.   - Repeat CMP in 3 months, f/u in 3 months for weight check.

## 2024-01-06 NOTE — Patient Instructions (Addendum)
 1) We are starting you on a weight loss medication called Contrave:  Week 1: take 1 tablet in the morning Week 2: 1 tablet in the morning, 1 tablet in the evening Week 3: 2 tablets in the morning and 1 tablet in the evening    2) I have put a referral to general surgery for evaluation of hemorrhoids.

## 2024-01-06 NOTE — Progress Notes (Signed)
 Established Patient Office Visit   Subjective  Patient ID: Annette Valencia, female    DOB: 1965-12-03  Age: 58 y.o. MRN: [id number]  Chief Complaint  Patient presents with   PAP    She  has a past medical history of Arthritis, Chicken pox, Frequent headaches, Hyperlipidemia, Hypertension, Hypothyroidism, Migraines, PONV (postoperative nausea and vomiting), Scoliosis, Sleep apnea, and UTI (urinary tract infection).  HPI Patient was last seen by me on 12/09/2023 and presents for a follow up visit:   1) Due for second dose of Twinrix immunization. She had first vaccine on 12/09/23. Tolerated it well. She will be due for third dose of Twinrix in 6 Months from now around 07/07/2024.    2) Due for screening for HPV and PAP. Patient had previous PAP in [place] . She denies previous abnormal PAP.   3) Obesity with elevated LFTs likely secondary to Metabolic dysfunction-associated steatotic liver disease (MASLD). Patient has been exercising daily, she swims 12 laps daily. Her mom dies from lung cancer. Patient's mom also had thyroidectomy at a younger age. Patient is not sue if her mom had thyroid  cancer.    ROS As per HPI    Objective:     BP 112/82   Pulse 79   Temp 98.2 F (36.8 C) (Oral)   Wt 256 lb 12.8 oz (116.5 kg)   SpO2 94%   BMI 40.22 kg/m      01/06/2024    9:06 AM 12/09/2023    9:23 AM 08/29/2023   11:09 AM  Depression screen PHQ 2/9  Decreased Interest 0 2 0  Down, Depressed, Hopeless 0 0 0  PHQ - 2 Score 0 2 0  Altered sleeping 0 0 0  Tired, decreased energy 0 0 0  Change in appetite 0 0 0  Feeling bad or failure about yourself  0 0 0  Trouble concentrating 0 0 0  Moving slowly or fidgety/restless 0 0 0  Suicidal thoughts 0 0 0  PHQ-9 Score 0 2 0  Difficult doing work/chores Not difficult at all Not difficult at all Not difficult at all      01/06/2024    9:06 AM 12/09/2023    9:23 AM 08/29/2023   11:09 AM 07/25/2023   11:57 AM  GAD 7 : Generalized  Anxiety Score  Nervous, Anxious, on Edge 0 0 0 0  Control/stop worrying 0 0 0 0  Worry too much - different things 0 0 0 0  Trouble relaxing 0 0 0 0  Restless 0 0 0 0  Easily annoyed or irritable 0 2 0 0  Afraid - awful might happen 0 0 0 0  Total GAD 7 Score 0 2 0 0  Anxiety Difficulty Not difficult at all Not difficult at all Not difficult at all Not difficult at all    Physical Exam Exam conducted with a chaperone present.  Constitutional:      Appearance: Normal appearance. She is obese.  HENT:     Head: Normocephalic.     Mouth/Throat:     Mouth: Mucous membranes are moist.  Neck:     Thyroid : No thyroid  mass or thyroid  tenderness.  Cardiovascular:     Rate and Rhythm: Normal rate and regular rhythm.  Pulmonary:     Effort: Pulmonary effort is normal.     Breath sounds: Normal breath sounds.  Abdominal:     General: Bowel sounds are normal.     Palpations: Abdomen is soft.  Genitourinary:  General: Normal vulva.     Vagina: Normal.     Cervix: Normal. No cervical motion tenderness.     Comments: External hemorrhoids without thrombosis and tenderness noted. PAP done today.  Musculoskeletal:     Cervical back: Neck supple. No rigidity.     Right lower leg: No edema.     Left lower leg: No edema.  Skin:    General: Skin is warm.  Neurological:     Mental Status: She is alert and oriented to person, place, and time.  Psychiatric:        Mood and Affect: Mood normal.        Behavior: Behavior normal.        No results found for any visits on 01/06/24.  The 10-year ASCVD risk score (Arnett DK, et al., 2019) is: 4%    Assessment & Plan:  Patient is a pleasant 58 year old female presenting for cervical/PAP screening, hep A/B immunization and to discuss pharmacological interventions to help with weight loss. Second dose of Twinrix done today. She will receive third and last dose after 07/07/24.  Need for hepatitis A and B vaccination Assessment &  Plan: Twinrix first dose: First dose was done on 12/09/23. No side effects.  Twinrix second dose done today. Recommend third dose in 6 months, after 07/07/24.   Orders: -     Hepatitis A hepatitis B combined vaccine IM  Screening for cervical cancer Assessment & Plan: PAP/HPV sample obtained today. Patient tolerated the procedure well. We will update her on results. If both normal, repeat PAP in 5 years. If wither abnormal we will make recommendations accordingly.   Orders: -     Cytology - PAP  Screening for HPV (human papillomavirus) Assessment & Plan: PAP/HPV sample obtained today. Patient tolerated the procedure well. We will update her on results. If both normal, repeat PAP in 5 years. If wither abnormal we will make recommendations accordingly.   Orders: -     Cytology - PAP  Class 3 severe obesity with serious comorbidity and body mass index (BMI) of 40.0 to 44.9 in adult, unspecified obesity type [facility]) Assessment & Plan: Lifestyle and pharmacological intervention discussed today. There is a concern that patient's mom had thyroid  disorder( unknown and unsure if she had thyroid  cancer). Discussed against starting patient on GLP-1 given increased risk of medullary thyroid  cancer.  - Recommend starting patient on Contrave, s/e dicussed.  - Dosing as following: Naltrexone-buPROPion HCl ER 8-90 Mg Start 1 tablet every morning for 7 days, then 1 tablet twice daily for 7 days,  then 2 tablets every morning and one in the evening. Can go up to 2 tablets in the morning and 2 tablets in the evening if patient tolerates the medication.   - Repeat CMP in 3 months, f/u in 3 months for weight check.   Orders: -     Naltrexone-buPROPion HCl ER; Start 1 tablet every morning for 7 days, then 1 tablet twice daily for 7 days, then 2 tablets every morning and one in the evening  Dispense: 120 tablet; Refill: 1  External hemorrhoid Assessment & Plan: Colonoscopy up-to-date. Had been bleeding  intermittently. Referral to general surgery done today for further evaluation and management. Continue high fiber diet, increase daily water  intake, exercise, reduce straining.   Orders: -     Ambulatory referral to General Surgery  Elevated LFTs Assessment & Plan: Repeat CMP and get GGT in 3 months lab ordered. Likely related to Metabolic dysfunction-associated  steatotic liver disease (MASLD). Started patient on Contrave today.   Orders: -     Naltrexone-buPROPion HCl ER; Start 1 tablet every morning for 7 days, then 1 tablet twice daily for 7 days, then 2 tablets every morning and one in the evening  Dispense: 120 tablet; Refill: 1 -     Comprehensive metabolic panel with GFR; Future -     Gamma GT; Future    Return in about 3 months (around 04/06/2024).   Jacklin Mascot, MD

## 2024-01-06 NOTE — Assessment & Plan Note (Signed)
 Repeat CMP and get GGT in 3 months lab ordered. Likely related to Metabolic dysfunction-associated steatotic liver disease (MASLD). Started patient on Contrave today.

## 2024-01-09 ENCOUNTER — Ambulatory Visit: Admitting: General Surgery

## 2024-01-09 LAB — CYTOLOGY - PAP
Comment: NEGATIVE
Diagnosis: NEGATIVE
High risk HPV: NEGATIVE

## 2024-01-22 ENCOUNTER — Ambulatory Visit (INDEPENDENT_AMBULATORY_CARE_PROVIDER_SITE_OTHER): Admitting: Surgery

## 2024-01-22 ENCOUNTER — Encounter: Payer: Self-pay | Admitting: Surgery

## 2024-01-22 VITALS — BP 115/78 | HR 74 | Temp 98.9°F | Ht 67.0 in | Wt 258.0 lb

## 2024-01-22 DIAGNOSIS — K648 Other hemorrhoids: Secondary | ICD-10-CM | POA: Diagnosis not present

## 2024-01-22 DIAGNOSIS — K644 Residual hemorrhoidal skin tags: Secondary | ICD-10-CM | POA: Diagnosis not present

## 2024-01-22 NOTE — Progress Notes (Signed)
 01/22/2024  Reason for Visit:  Internal and external hemorrhoids  Requesting Provider:  Jacklin Mascot, MD  History of Present Illness: Annette Valencia is a 58 y.o. female presents for evaluation of internal and external hemorrhoids.  The patient reports that she's been dealing with hemorrhoid issues for many years.  Overall her main symptoms are related to both internal and external components.  She will have episodes of intermittent bleeding, and sometimes can be more profuse that gets her bed sheets stained.  She also has issues with hygiene after bowel movements due to her external hemorrhoids, which results in burning sensation and itching.  She has tried suppositories in the past and currently she uses Tucks pads and also soft wipes to wipe after a bowel movement.  She does not do Sitz baths.  She does report issues with constipation, and although it is not all the time, she does have to strain harder for a bowel movement and also the stool is harder.  She does feel that this is more related with her bleeding episodes.  Of note, she works from home and sits at her desk for long hours each day.  She also had a colonoscopy with Dr. Baldomero Bone on 02/08/23 which found polyps in the ascending colon, cecum, and rectum, in addition to diverticulosis.  Past Medical History: Past Medical History:  Diagnosis Date   Arthritis    knees   Chicken pox    Frequent headaches    Hyperlipidemia    Hypertension    Hypothyroidism    Migraines    PONV (postoperative nausea and vomiting)    Scoliosis    Sleep apnea    CPAP   UTI (urinary tract infection)      Past Surgical History: Past Surgical History:  Procedure Laterality Date   CERVICAL ABLATION  2017   CESAREAN SECTION  1988   CHOLECYSTECTOMY     COLONOSCOPY WITH PROPOFOL  N/A 08/06/2017   Procedure: COLONOSCOPY WITH PROPOFOL ;  Surgeon: Irby Mannan, MD;  Location: [facility];  Service: Endoscopy;  Laterality: N/A;  please keep  early   COLONOSCOPY WITH PROPOFOL  N/A 02/08/2023   Procedure: COLONOSCOPY WITH PROPOFOL ;  Surgeon: Selena Daily, MD;  Location: [facility];  Service: Gastroenterology;  Laterality: N/A;   TUBAL LIGATION     tubes tied  2000    Home Medications: Prior to Admission medications   Medication Sig Start Date End Date Taking? Authorizing Provider  atorvastatin  (LIPITOR) 40 MG tablet Take 1 tablet (40 mg total) by mouth daily. 12/09/23  Yes Bair, Kalpana, MD  fluocinonide  (LIDEX ) 0.05 % external solution Apply topically 2 (two) times daily. Patient taking differently: Apply topically 2 (two) times daily as needed. 07/18/23  Yes Webb, Padonda B, FNP  hydrochlorothiazide  (HYDRODIURIL ) 25 MG tablet Take 1 tablet (25 mg total) by mouth daily. 12/09/23  Yes Bair, Kalpana, MD  levothyroxine  (SYNTHROID ) 125 MCG tablet Take 1 tablet (125 mcg total) by mouth daily. 12/09/23  Yes Bair, Kalpana, MD  losartan  (COZAAR ) 100 MG tablet TAKE 1 TABLET(100 MG) BY MOUTH DAILY 12/09/23  Yes Bair, Kalpana, MD  meloxicam  (MOBIC ) 7.5 MG tablet TAKE 1 TABLET(7.5 MG) BY MOUTH DAILY as needed 11/26/23  Yes Webb, Padonda B, FNP  metFORMIN  (GLUCOPHAGE -XR) 500 MG 24 hr tablet Take 1 tablet (500 mg total) by mouth 2 (two) times daily with a meal. 12/09/23  Yes Bair, Kalpana, MD  Multiple Vitamins-Minerals (MULTIVITAMIN WOMEN PO) Take by mouth.   Yes [provider]  pantoprazole  (PROTONIX ) 40 MG tablet Take 1 tablet (40 mg total) by mouth daily. 12/09/23  Yes Bair, Kalpana, MD  VITAMIN D PO Take by mouth daily.   Yes [provider]    Allergies: No Known Allergies  Social History:  reports that she quit smoking about 31 years ago. Her smoking use included cigarettes. She has never used smokeless tobacco. She reports current alcohol use of about 10.0 standard drinks of alcohol per week. She reports that she does not use drugs.   Family History: Family History  Problem Relation Age of Onset   Arthritis  Mother    Lung cancer Mother    Hyperlipidemia Mother    Alcohol abuse Father    Stroke Father    Hypertension Father    ALS Father    Hypertension Maternal Grandmother     Review of Systems: Review of Systems  Constitutional:  Negative for chills and fever.  HENT:  Negative for hearing loss.   Respiratory:  Negative for shortness of breath.   Cardiovascular:  Negative for chest pain.  Gastrointestinal:  Positive for blood in stool and constipation. Negative for nausea and vomiting.  Genitourinary:  Negative for dysuria.  Musculoskeletal:  Negative for myalgias.  Skin:  Positive for itching.  Neurological:  Negative for dizziness.  Psychiatric/Behavioral:  Negative for depression.     Physical Exam BP 115/78   Pulse 74   Temp 98.9 F (37.2 C) (Oral)   Ht 5\' 7"  (1.702 m)   Wt 258 lb (117 kg)   SpO2 95%   BMI 40.41 kg/m  CONSTITUTIONAL: No acute distress HEENT:  Normocephalic, atraumatic, extraocular motion intact. NECK: Trachea is midline, and there is no jugular venous distension.  RESPIRATORY:  Lungs are clear, and breath sounds are equal bilaterally. Normal respiratory effort without pathologic use of accessory muscles. CARDIOVASCULAR: Heart is regular without murmurs, gallops, or rubs. RECTAL:  External exam reveals enlarged external hemorrhoids, with right anterior being the largest, followed by left lateral, then right posterior.  No anal fissures or other lesions noted.  There is residual stool around the anal verge consistent with her description of difficulty with hygiene.  On digital rectal exam, there are also palpable enlarged internal hemorrhoids, mostly right anterior and posterior.  She also has an enlarged likely anal papilla anteriorly.  No gross blood. MUSCULOSKELETAL:  Normal muscle strength and tone in all four extremities.  No peripheral edema or cyanosis. SKIN: Skin turgor is normal. There are no pathologic skin lesions.  NEUROLOGIC:  Motor and sensation  is grossly normal.  Cranial nerves are grossly intact. PSYCH:  Alert and oriented to person, place and time. Affect is normal.  Laboratory Analysis: Labs from 12/09/23: Na 143, K 3.6, Cl 102, CO2 31, BUN 22, Cr 0.86.  Total bili 0.9, AST 40, ALT 52, Alk Phos 129, albumin 4.5.  WBC 7.6, Hgb 14.2, Hct 41.9, Plt 197.  HgA1c 6.3  Imaging: Colonoscopy on 02/08/23: Impression:  - Two 5 to 6 mm polyps in the ascending colon and in the cecum, removed with a cold snare. Resected and retrieved.  - One 6 mm polyp in the rectum, removed with a cold snare. Resected and retrieved. Clip manufacturer: [employer]. Clip ( MR safe) was placed.  - The distal rectum and anal verge are normal on retroflexion view.  - Diverticulosis in the entire examined colon.   Assessment and Plan: This is a 58 y.o. female with internal and external hemorrhoids.  --Discussed with the  patient more about hemorrhoids, and how they're divided into internal and external components, the symptoms from each as they flare up, and the possible issues that can happen in relation to them.  On exam, she does have enlarged internal and external components.  Discussed with her that her bleeding episodes are likely from the internal hemorrhoids being irritated with the hard stool and straining.  The hygiene issues are related to the external components and their enlarged size making it difficult to clean well.  This leads to pruritus ani. --Discussed with the patient that currently there are no inflamed tissues and does not need prescriptions for Anusol , Lidocaine , etc.  Did recommend that she needs to work on constipation issues, and recommended that she take MiraLax  once daily, increase her fiber intake, and drink more fluids.  Discussed with her also that we could continue watchful waiting vs proceeding with hemorrhoidectomy, and she would like to proceed with surgery. --Discussed with her then the plan for an Exam under Anesthesia and  hemorrhoidectomy of 2 columns.  Reviewed with her the surgery at length including the planned incisions, the risks, benefits, post-operative pain control and duration of time to expect soreness/tenderness, discharge medications, that this would be under general anesthesia, and that this is an outpatient surgery, and she's willing to proceed. --Will schedule the patient for surgery on 03/26/24 based on her scheduling preference.  Will see her early July for H&P update and follow up.  All of her questions have been answered.   I spent 40 minutes dedicated to the care of this patient on the date of this encounter to include pre-visit review of records, face-to-face time with the patient discussing diagnosis and management, and any post-visit coordination of care.   Marene Shape, MD [facility]

## 2024-01-22 NOTE — Patient Instructions (Addendum)
 You have requested to have Hemorrhoid surgery today. This will be scheduled at [facility] with Dr Mauri Sous.  If you are on any injectable weight loss medication, you will need to stop taking your GLP-1 injectable (weight loss) medications 8 days before your surgery to avoid any complications with anesthesia.   Please review the information below and your [facility] Information. Our surgery scheduler will call you to review surgery date and to go over information.  If you have FMLA or disability paperwork that needs filled out you may drop this off at our office or this can be faxed to [phone].  You will be required to do 2 enemas prior to your surgery. The first will be the night prior and the second will be done the morning of surgery.  Constipation is going to be your biggest obstacle following surgery. Use all stool softeners and laxatives as prescribed after your surgery and be sure to drink 72 ounces of water  or more every day. This will help to avoid constipation. If you do all of this and you are still having difficulty, please call our office for further instructions as soon as you begin to have difficulty with bowel movements.  You may want to buy a disposable Sitz bath prior to surgery to aid in pain and cleanliness after surgery. The information on how to do use a disposable sitz bath or using a bath tub are below.  You will be out of work 1-2 weeks depending on how your healing goes. If you have FMLA/Disability paperwork that needs filled out you may drop this off at the office or fax it to [phone].   Hemorrhoid Surgery After Care Refer to this sheet in the next few weeks. These instructions provide you with information about caring for yourself after your procedure. Your health care provider may also give you more specific instructions. Your treatment has been planned according to current medical practices, but problems sometimes occur. Call your health care provider if you  have any problems or questions after your procedure. What can I expect after the procedure? After the procedure, it is common to have: Rectal pain. Pain when you are having a bowel movement. Slight rectal bleeding.  Follow these instructions at home: Medicines Take over-the-counter and prescription medicines only as told by your health care provider. Do not drive or operate heavy machinery while taking prescription pain medicine. Use a stool softener or a bulk laxative as told by your health care provider. Activity Rest at home. Return to your normal activities as told by your health care provider. Do not lift anything that is heavier than 10 lb (4.5 kg). Do not sit for long periods of time. Take a walk every day or as told by your health care provider. Do not strain to have a bowel movement. Do not spend a long time sitting on the toilet. Eating and drinking Eat foods that contain fiber, such as whole grains, beans, nuts, fruits, and vegetables. Drink enough fluid to keep your urine clear or pale yellow. General instructions Sit in a warm bath 2-3 times per day to relieve soreness or itching. Keep all follow-up visits as told by your health care provider. This is important. Contact a health care provider if: Your pain medicine is not helping. You have a fever or chills. You become constipated. You have trouble passing urine. Get help right away if: You have very bad rectal pain. You have heavy bleeding from your rectum. This information is not intended to  replace advice given to you by your health care provider. Make sure you discuss any questions you have with your health care provider. Document Released: 11/17/2003 Document Revised: 02/02/2016 Document Reviewed: 11/22/2014 Elsevier Interactive Patient Education  2018 [employer].   Disposable Sitz Bath A disposable sitz bath is a plastic basin that fits over the toilet. A bag is hung above the toilet, and the bag is  connected to a tube that opens into the basin. The bag is filled with warm water  that flows into the basin through the tube. A sitz bath can be used to help relieve symptoms, clean, and promote healing in the genital and anal areas, as well as in the lower abdomen and buttocks. What are the risks? Sitz baths are generally very safe. It is possible for the skin between the genitals and the anus (perineum) to become infected, but this is rare. You can avoid this by cleaning your sitz bath supplies thoroughly. How to use a disposable sitz bath Close the clamp on the tube. Make sure the clamp is closed tightly to prevent leakage. Fill the sitz bath basin and the plastic bag with warm water . The water  should be warm enough to be comfortable, but not hot. Raise the toilet seat and place the filled basin on the toilet. Make sure the overflow opening is facing toward the back of the toilet. If you prefer, you may place the empty basin on the toilet first, and then use the plastic bag to fill the basin with warm water . Hang the filled plastic bag overhead on a hook or towel rack close to the toilet. The bag should be higher than the toilet so that the water  will flow down through the tube. Attach the tube to the opening on the basin. Make sure that the tube is attached to the basin tightly to prevent leakage. Sit on the basin and release the clamp. This will allow warm water  to flow into the basin and flush the area around your genitals and anus. Remain sitting on the basin for about 15-20 minutes, or as long as told by your health care provider. Stand up and gently pat your skin dry. If directed, apply clean bandages (dressings) to the affected area as told by your health care provider. Carefully remove the basin from the toilet seat and tip the basin into the toilet to empty any remaining water . Empty any remaining water  from the plastic bag into the toilet. Then, flush the toilet. Wash the basin with warm  water  and soap. Let the basin air dry in the sink. You should also let the plastic bag and the tubing air dry. Store the basin, tubing, and plastic bag in a clean, dry area. Wash your hands with soap and water . If soap and water  are not available, use hand sanitizer. Contact a health care provider if: You have symptoms that get worse instead of better. You develop new skin irritation, redness, or swelling around your genitals or anus. This information is not intended to replace advice given to you by your health care provider. Make sure you discuss any questions you have with your health care provider. Document Released: 02/26/2012 Document Revised: 02/02/2016 Document Reviewed: 07/17/2015 Elsevier Interactive Patient Education  2018 [employer].   How to Take a [employer] A sitz bath is a warm water  bath that is taken while you are sitting down. The water  should only come up to your hips and should cover your buttocks. Your health care provider may  recommend a sitz bath to help you: Clean the lower part of your body, including your genital area. With itching. With pain. With sore muscles or muscles that tighten or spasm.  How to take a sitz bath Take 3-4 sitz baths per day or as told by your health care provider. Partially fill a bathtub with warm water . You will only need the water  to be deep enough to cover your hips and buttocks when you are sitting in it. If your health care provider told you to put medicine in the water , follow the directions exactly. Sit in the water  and open the tub drain a little. Turn on the warm water  again to keep the tub at the correct level. Keep the water  running constantly. Soak in the water  for 15-20 minutes or as told by your health care provider. After the sitz bath, pat the affected area dry first. Do not rub it. Be careful when you stand up after the sitz bath because you may feel dizzy.  Contact a health care provider if: Your symptoms get worse.  Do not continue with sitz baths if your symptoms get worse. You have new symptoms. Do not continue with sitz baths until you talk with your health care provider. This information is not intended to replace advice given to you by your health care provider. Make sure you discuss any questions you have with your health care provider. Document Released: 05/19/2004 Document Revised: 01/25/2016 Document Reviewed: 08/25/2014 Elsevier Interactive Patient Education  [employer].

## 2024-01-27 ENCOUNTER — Telehealth: Payer: Self-pay | Admitting: Surgery

## 2024-01-27 NOTE — Telephone Encounter (Signed)
 Patient has been advised of Pre-Admission date/time, and Surgery date at [facility].  Surgery Date: 03/26/24 Preadmission Testing Date: Preadmissions to call patient.   Patient informed of the scheduling process and surgery information given at time of office visit.   Patient has been made aware to call [phone], between 1-3:00pm the day before surgery, to find out what time to arrive for surgery.

## 2024-02-04 ENCOUNTER — Encounter: Payer: Self-pay | Admitting: Surgery

## 2024-02-04 ENCOUNTER — Telehealth: Payer: Self-pay | Admitting: Surgery

## 2024-02-04 NOTE — Telephone Encounter (Signed)
 Patient has decided not to undergo surgery at this time for hemorrhoidectomy on 03/26/24 with Dr. Mauri Sous.  At patient's request surgery is cancelled.

## 2024-02-07 DIAGNOSIS — G4733 Obstructive sleep apnea (adult) (pediatric): Secondary | ICD-10-CM | POA: Diagnosis not present

## 2024-02-19 ENCOUNTER — Telehealth: Payer: Self-pay

## 2024-02-19 DIAGNOSIS — G4730 Sleep apnea, unspecified: Secondary | ICD-10-CM

## 2024-02-19 NOTE — Telephone Encounter (Signed)
 Got DME request for CPAP supplies. I called the patient to let her know do not manage CPAP. I put in a referral to sleep specialist Dr. Kieran Pellet for further evaluation and management of OSA. Patient agrees with above plan.   We will fax back the request with above information to the DME company.   1. Sleep apnea in adult (Primary) - Ambulatory referral to Pulmonology   Jacklin Mascot, MD

## 2024-02-22 ENCOUNTER — Other Ambulatory Visit: Payer: Self-pay | Admitting: Family

## 2024-02-28 ENCOUNTER — Ambulatory Visit: Admitting: Sleep Medicine

## 2024-03-10 ENCOUNTER — Other Ambulatory Visit: Payer: Self-pay

## 2024-03-10 MED ORDER — MELOXICAM 7.5 MG PO TABS: ORAL_TABLET | ORAL | 0 refills | Status: DC

## 2024-03-11 ENCOUNTER — Ambulatory Visit: Admitting: Surgery

## 2024-03-12 ENCOUNTER — Encounter: Payer: Self-pay | Admitting: Sleep Medicine

## 2024-03-12 ENCOUNTER — Ambulatory Visit: Admitting: Sleep Medicine

## 2024-03-12 VITALS — BP 118/80 | HR 85 | Temp 97.8°F | Ht 67.0 in | Wt 263.2 lb

## 2024-03-12 DIAGNOSIS — I10 Essential (primary) hypertension: Secondary | ICD-10-CM | POA: Diagnosis not present

## 2024-03-12 DIAGNOSIS — Z6841 Body Mass Index (BMI) 40.0 and over, adult: Secondary | ICD-10-CM | POA: Diagnosis not present

## 2024-03-12 DIAGNOSIS — Z87891 Personal history of nicotine dependence: Secondary | ICD-10-CM

## 2024-03-12 DIAGNOSIS — G4733 Obstructive sleep apnea (adult) (pediatric): Secondary | ICD-10-CM | POA: Diagnosis not present

## 2024-03-12 NOTE — Patient Instructions (Signed)

## 2024-03-12 NOTE — Progress Notes (Signed)
 Name:Annette Valencia MRN: [id number] DOB: 1966/07/19   CHIEF COMPLAINT:  ESTABLISH CARE FOR OSA   HISTORY OF PRESENT ILLNESS:  Annette Valencia is a 58 y.o. w/ a h/o OSA, HTN, hypothyroidism, DMII, hyperlipidemia and morbid obesity who presents to establish care for OSA. Reports that she was initially diagnosed with OSA several years and was subsequently started on CPAP therapy. She is currently using the Airfit N20 nasal mask, which is comfortable. Reports feeling more refreshed upon awakening with CPAP therapy. Reports some slight snoring with CPAP therapy. Reports nocturnal awakenings due to unclear reasons, however does not have difficulty falling back to sleep. Reports night sweats. Denies any significant weight changes. Denies morning headaches, RLS symptoms, dream enactment, cataplexy, hypnagogic or hypnapompic hallucinations. Denies a family history of sleep apnea. Reports occasional drowsy driving. Drinks 2-3 cups of coffee daily, drinks 1-2 glasses of wine nightly, denies tobacco or illicit drug use.   Bedtime 9:30-10:30 pm Sleep onset 5 mins Rise time 7 am   EPWORTH SLEEP SCORE 7    03/12/2024    8:00 AM  Results of the Epworth flowsheet  Sitting and reading 1  Watching TV 1  Sitting, inactive in a public place (e.g. a theatre or a meeting) 0  As a passenger in a car for an hour without a break 1  Lying down to rest in the afternoon when circumstances permit 3  Sitting and talking to someone 0  Sitting quietly after a lunch without alcohol 1  In a car, while stopped for a few minutes in traffic 0  Total score 7     PAST MEDICAL HISTORY :   has a past medical history of Arthritis, Chicken pox, Frequent headaches, Hyperlipidemia, Hypertension, Hypothyroidism, Migraines, PONV (postoperative nausea and vomiting), Scoliosis, Sleep apnea, and UTI (urinary tract infection).  has a past surgical history that includes tubes tied (2000); Cesarean section (1988); Cervical  ablation (2017); Colonoscopy with propofol  (N/A, 08/06/2017); Cholecystectomy; Tubal ligation; and Colonoscopy with propofol  (N/A, 02/08/2023). Prior to Admission medications   Medication Sig Start Date End Date Taking? Authorizing Provider  atorvastatin  (LIPITOR) 40 MG tablet Take 1 tablet (40 mg total) by mouth daily. 12/09/23  Yes Bair, Kalpana, MD  fluocinonide  (LIDEX ) 0.05 % external solution Apply topically 2 (two) times daily. Patient taking differently: Apply topically 2 (two) times daily as needed. 07/18/23  Yes Webb, Padonda B, FNP  hydrochlorothiazide  (HYDRODIURIL ) 25 MG tablet Take 1 tablet (25 mg total) by mouth daily. 12/09/23  Yes Bair, Kalpana, MD  levothyroxine  (SYNTHROID ) 125 MCG tablet Take 1 tablet (125 mcg total) by mouth daily. 12/09/23  Yes Bair, Kalpana, MD  losartan  (COZAAR ) 100 MG tablet TAKE 1 TABLET(100 MG) BY MOUTH DAILY 12/09/23  Yes Bair, Kalpana, MD  meloxicam  (MOBIC ) 7.5 MG tablet TAKE 1 TABLET(7.5 MG) BY MOUTH DAILY as needed 03/10/24  Yes Bair, Kalpana, MD  metFORMIN  (GLUCOPHAGE -XR) 500 MG 24 hr tablet Take 1 tablet (500 mg total) by mouth 2 (two) times daily with a meal. 12/09/23  Yes Bair, Kalpana, MD  Multiple Vitamins-Minerals (MULTIVITAMIN WOMEN PO) Take by mouth.   Yes [provider]  pantoprazole  (PROTONIX ) 40 MG tablet Take 1 tablet (40 mg total) by mouth daily. 12/09/23  Yes Bair, Kalpana, MD  VITAMIN D PO Take by mouth daily.   Yes [provider]   No Known Allergies  FAMILY HISTORY:  family history includes ALS in her father; Alcohol abuse in her father; Arthritis in her mother;  Hyperlipidemia in her mother; Hypertension in her father and maternal grandmother; Lung cancer in her mother; Stroke in her father. SOCIAL HISTORY:  reports that she quit smoking about 31 years ago. Her smoking use included cigarettes. She has never used smokeless tobacco. She reports current alcohol use of about 10.0 standard drinks of alcohol per week. She reports  that she does not use drugs.   Review of Systems:  Gen:  Denies  fever, sweats, chills weight loss  HEENT: Denies blurred vision, double vision, ear pain, eye pain, hearing loss, nose bleeds, sore throat Cardiac:  No dizziness, chest pain or heaviness, chest tightness,edema, No JVD Resp:   No cough, -sputum production, -shortness of breath,-wheezing, -hemoptysis,  Gi: Denies swallowing difficulty, stomach pain, nausea or vomiting, diarrhea, constipation, bowel incontinence Gu:  Denies bladder incontinence, burning urine Ext:   Denies Joint pain, stiffness or swelling Skin: Denies  skin rash, easy bruising or bleeding or hives Endoc:  Denies polyuria, polydipsia , polyphagia or weight change Psych:   Denies depression, insomnia or hallucinations  Other:  All other systems negative  VITAL SIGNS: BP 118/80 (BP Location: Right Arm, Cuff Size: Large)   Pulse 85   Temp 97.8 F (36.6 C)   Ht 5' 7 (1.702 m)   Wt 263 lb 3.2 oz (119.4 kg)   SpO2 95%   BMI 41.22 kg/m    Physical Examination:   General Appearance: No distress  EYES PERRLA, EOM intact.   NECK Supple, No JVD Pulmonary: normal breath sounds, No wheezing.  CardiovascularNormal S1,S2.  No m/r/g.   Abdomen: Benign, Soft, non-tender. Skin:   warm, no rashes, no ecchymosis  Extremities: normal, no cyanosis, clubbing. Neuro:without focal findings,  speech normal  PSYCHIATRIC: Mood, affect within normal limits.   ASSESSMENT AND PLAN  OSA Will send order for new CPAP device with supplies to [facility]. Discussed the consequences of untreated sleep apnea. Advised not to drive drowsy for safety of patient and others. Will follow up in 3 months.     HTN Stable, on current management. Following with PCP.   Morbid obesity Counseled patient on diet and lifestyle modification.    Patient  satisfied with Plan of action and management. All questions answered  I spent a total of 47 minutes reviewing chart data,  face-to-face evaluation with the patient, counseling and coordination of care as detailed above.    Fedrick Cefalu, M.D.  Sleep Medicine [facility]

## 2024-03-18 ENCOUNTER — Other Ambulatory Visit

## 2024-03-25 DIAGNOSIS — G4733 Obstructive sleep apnea (adult) (pediatric): Secondary | ICD-10-CM | POA: Diagnosis not present

## 2024-03-26 ENCOUNTER — Ambulatory Visit: Admit: 2024-03-26 | Admitting: Surgery

## 2024-03-26 SURGERY — EXAM UNDER ANESTHESIA, RECTUM
Anesthesia: General

## 2024-03-27 ENCOUNTER — Other Ambulatory Visit: Payer: Self-pay | Admitting: Family

## 2024-03-27 DIAGNOSIS — K219 Gastro-esophageal reflux disease without esophagitis: Secondary | ICD-10-CM

## 2024-04-04 ENCOUNTER — Other Ambulatory Visit: Payer: Self-pay

## 2024-04-04 DIAGNOSIS — H524 Presbyopia: Secondary | ICD-10-CM | POA: Diagnosis not present

## 2024-04-04 DIAGNOSIS — E1165 Type 2 diabetes mellitus with hyperglycemia: Secondary | ICD-10-CM

## 2024-04-04 DIAGNOSIS — H2513 Age-related nuclear cataract, bilateral: Secondary | ICD-10-CM | POA: Diagnosis not present

## 2024-04-04 DIAGNOSIS — E119 Type 2 diabetes mellitus without complications: Secondary | ICD-10-CM | POA: Diagnosis not present

## 2024-04-05 NOTE — Telephone Encounter (Signed)
 Will hold to refill at [facility] on 7/28

## 2024-04-06 ENCOUNTER — Ambulatory Visit (INDEPENDENT_AMBULATORY_CARE_PROVIDER_SITE_OTHER)

## 2024-04-06 VITALS — BP 110/70 | HR 81 | Temp 97.9°F | Resp 20 | Ht 67.0 in | Wt 262.1 lb

## 2024-04-06 DIAGNOSIS — E66813 Obesity, class 3: Secondary | ICD-10-CM

## 2024-04-06 DIAGNOSIS — E1165 Type 2 diabetes mellitus with hyperglycemia: Secondary | ICD-10-CM | POA: Diagnosis not present

## 2024-04-06 DIAGNOSIS — E782 Mixed hyperlipidemia: Secondary | ICD-10-CM

## 2024-04-06 DIAGNOSIS — E039 Hypothyroidism, unspecified: Secondary | ICD-10-CM

## 2024-04-06 DIAGNOSIS — M1711 Unilateral primary osteoarthritis, right knee: Secondary | ICD-10-CM

## 2024-04-06 DIAGNOSIS — K219 Gastro-esophageal reflux disease without esophagitis: Secondary | ICD-10-CM | POA: Diagnosis not present

## 2024-04-06 DIAGNOSIS — R7401 Elevation of levels of liver transaminase levels: Secondary | ICD-10-CM

## 2024-04-06 DIAGNOSIS — K644 Residual hemorrhoidal skin tags: Secondary | ICD-10-CM

## 2024-04-06 DIAGNOSIS — R7989 Other specified abnormal findings of blood chemistry: Secondary | ICD-10-CM

## 2024-04-06 DIAGNOSIS — Z23 Encounter for immunization: Secondary | ICD-10-CM

## 2024-04-06 DIAGNOSIS — Z7984 Long term (current) use of oral hypoglycemic drugs: Secondary | ICD-10-CM

## 2024-04-06 DIAGNOSIS — G4730 Sleep apnea, unspecified: Secondary | ICD-10-CM

## 2024-04-06 DIAGNOSIS — Z6841 Body Mass Index (BMI) 40.0 and over, adult: Secondary | ICD-10-CM

## 2024-04-06 DIAGNOSIS — I10 Essential (primary) hypertension: Secondary | ICD-10-CM

## 2024-04-06 MED ORDER — PANTOPRAZOLE SODIUM 40 MG PO TBEC: 40.0000 mg | DELAYED_RELEASE_TABLET | Freq: Every day | ORAL | 1 refills | Status: AC

## 2024-04-06 MED ORDER — MELOXICAM 7.5 MG PO TABS: ORAL_TABLET | ORAL | 1 refills | Status: DC

## 2024-04-06 MED ORDER — METFORMIN HCL ER 500 MG PO TB24: 500.0000 mg | ORAL_TABLET | Freq: Two times a day (BID) | ORAL | 3 refills | Status: AC

## 2024-04-06 NOTE — Assessment & Plan Note (Signed)
 Symptoms stable on meloxicam  7.5 mg daily.  Discussed potential future knee replacement surgery. Weight loss could reduce joint stress. Check renal function.

## 2024-04-06 NOTE — Assessment & Plan Note (Signed)
 Currently on pantoprazole  40 mg daily, symptoms well controlled, check B 12

## 2024-04-06 NOTE — Assessment & Plan Note (Signed)
 BP well controlled.  Continue losartan  100 mg and hydrochlorothiazide  25 mg daily.

## 2024-04-06 NOTE — Progress Notes (Signed)
 Established Patient Office Visit   Subjective  Patient ID: Annette Valencia, female    DOB: 12-17-1965  Age: 58 y.o. MRN: [id number]  Chief Complaint  Patient presents with   Diabetes    3 month follow up    She  has a past medical history of Arthritis, Chicken pox, Frequent headaches, Hilar lymphadenopathy (05/04/2018), Hyperlipidemia, Hypertension, Hypothyroidism, Migraines, PONV (postoperative nausea and vomiting), Scoliosis, Sleep apnea, and UTI (urinary tract infection).  Discussed the use of AI scribe software for clinical note transcription with the patient, who gave verbal consent to proceed.  History of Present Illness HPI: Annette Valencia is a 58 year old female who presents for a follow-up visit.  1) Hepatitis A/B immunization:  Twinrix:  First dose: 12/09/23 Second dose: 01/06/24 Third dose: After 9/31/2025   2) Hemorrhoids: Has history of hemorrhoids, saw surgery in 01/22/24. Less bothersome in the summer due to increased activity and swimming, aiding in cleanliness.  Patient at this time prefers against surgical intervention unless symptoms worsen.   3) OSA: Saw Dr. Jess on 03/12/24 for OSA on CPAP. Has a new CPAP which she uses every night.    4) DM type II: A1c stable on Metformin  500 mg BID. Needs refill. Tolerates it well.   5) Obesity, elevated LFTs: - Her weight has fluctuated recently, attributed to dietary changes while traveling and visiting her new grandchild.  - She has a history of fatty liver disease, confirmed by an ultrasound several years ago, with stable but elevated liver enzymes.  - She acknowledges the need for weight loss to improve her liver health. - She consumes more than ten alcoholic drinks per week, primarily wine with and after dinner, and recognizes the need to reduce her intake. She swims daily, which she enjoys and finds beneficial for her health. - She is not interested in starting weight loss medication at this time.  - Was treated  with Ozempic for DM in the past but her insurance stopped covering.   6) Right knee pain, chronic:  - She experiences knee pain, particularly in the right knee, and takes meloxicam  daily to manage the pain, which she attributes to scoliosis and potential need for knee replacement. She is hesitant to pursue surgical options at this time. No chest pain, shortness of breath, or swelling in her legs. She also takes Protonix  daily for gastrointestinal issues.   ROS As per HPI    Objective:     BP 110/70   Pulse 81   Temp 97.9 F (36.6 C)   Resp 20   Ht 5' 7 (1.702 m)   Wt 262 lb 2 oz (118.9 kg)   SpO2 96%   BMI 41.05 kg/m      04/06/2024    8:34 AM 01/06/2024    9:06 AM 12/09/2023    9:23 AM  Depression screen PHQ 2/9  Decreased Interest 1 0 2  Down, Depressed, Hopeless 0 0 0  PHQ - 2 Score 1 0 2  Altered sleeping 0 0 0  Tired, decreased energy 1 0 0  Change in appetite 0 0 0  Feeling bad or failure about yourself  1 0 0  Trouble concentrating 0 0 0  Moving slowly or fidgety/restless 0 0 0  Suicidal thoughts 0 0 0  PHQ-9 Score 3 0 2  Difficult doing work/chores Not difficult at all Not difficult at all Not difficult at all      04/06/2024    8:34 AM 01/06/2024  9:06 AM 12/09/2023    9:23 AM 08/29/2023   11:09 AM  GAD 7 : Generalized Anxiety Score  Nervous, Anxious, on Edge 0 0 0 0  Control/stop worrying 0 0 0 0  Worry too much - different things 0 0 0 0  Trouble relaxing 0 0 0 0  Restless 0 0 0 0  Easily annoyed or irritable 0 0 2 0  Afraid - awful might happen 0 0 0 0  Total GAD 7 Score 0 0 2 0  Anxiety Difficulty Not difficult at all Not difficult at all Not difficult at all Not difficult at all      04/06/2024    8:34 AM 01/06/2024    9:06 AM 12/09/2023    9:23 AM  Depression screen PHQ 2/9  Decreased Interest 1 0 2  Down, Depressed, Hopeless 0 0 0  PHQ - 2 Score 1 0 2  Altered sleeping 0 0 0  Tired, decreased energy 1 0 0  Change in appetite 0 0 0   Feeling bad or failure about yourself  1 0 0  Trouble concentrating 0 0 0  Moving slowly or fidgety/restless 0 0 0  Suicidal thoughts 0 0 0  PHQ-9 Score 3 0 2  Difficult doing work/chores Not difficult at all Not difficult at all Not difficult at all      04/06/2024    8:34 AM 01/06/2024    9:06 AM 12/09/2023    9:23 AM 08/29/2023   11:09 AM  GAD 7 : Generalized Anxiety Score  Nervous, Anxious, on Edge 0 0 0 0  Control/stop worrying 0 0 0 0  Worry too much - different things 0 0 0 0  Trouble relaxing 0 0 0 0  Restless 0 0 0 0  Easily annoyed or irritable 0 0 2 0  Afraid - awful might happen 0 0 0 0  Total GAD 7 Score 0 0 2 0  Anxiety Difficulty Not difficult at all Not difficult at all Not difficult at all Not difficult at all   SDOH Screenings   Food Insecurity: No Food Insecurity (12/10/2022)  Housing: Low Risk  (12/10/2022)  Transportation Needs: No Transportation Needs (12/10/2022)  Alcohol Screen: Low Risk  (12/10/2022)  Depression (PHQ2-9): Low Risk  (04/06/2024)  Financial Resource Strain: Low Risk  (12/10/2022)  Physical Activity: Insufficiently Active (12/10/2022)  Social Connections: Moderately Isolated (12/10/2022)  Stress: No Stress Concern Present (12/10/2022)  Tobacco Use: Medium Risk (04/06/2024)   Last Weight  Most recent update: 04/06/2024  8:02 AM    Weight  118.9 kg (262 lb 2 oz)              Physical Exam Constitutional:      Appearance: Normal appearance. She is obese.  HENT:     Head: Normocephalic and atraumatic.     Mouth/Throat:     Mouth: Mucous membranes are moist.  Neck:     Thyroid : No thyroid  mass or thyroid  tenderness.  Cardiovascular:     Rate and Rhythm: Normal rate and regular rhythm.  Pulmonary:     Effort: Pulmonary effort is normal.     Breath sounds: Normal breath sounds.  Abdominal:     General: Abdomen is protuberant. Bowel sounds are normal.     Palpations: Abdomen is soft.  Musculoskeletal:     Cervical back: Neck supple. No  rigidity.     Right knee: Swelling (mild swelling compared to left knee) and crepitus present. Tenderness (right inferiomedial patellar  pain on palpation) present.     Left knee: No swelling, bony tenderness or crepitus. No tenderness.     Right lower leg: No edema.     Left lower leg: No edema.  Skin:    General: Skin is warm.  Neurological:     Mental Status: She is alert and oriented to person, place, and time.  Psychiatric:        Mood and Affect: Mood normal.        Behavior: Behavior normal.        No results found for any visits on 04/06/24.  The 10-year ASCVD risk score (Arnett DK, et al., 2019) is: 4.4%     Assessment & Plan:   Type 2 diabetes mellitus with hyperglycemia, without long-term current use of insulin (HCC) -     metFORMIN  HCl ER; Take 1 tablet (500 mg total) by mouth 2 (two) times daily with a meal.  Dispense: 180 tablet; Refill: 3 -     Hemoglobin A1c; Future  Gastroesophageal reflux disease without esophagitis Assessment & Plan: Currently on pantoprazole  40 mg daily, symptoms well controlled, check B 12   Orders: -     Pantoprazole  Sodium; Take 1 tablet (40 mg total) by mouth daily.  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  - Weight loss is crucial for liver health.  - Discussed pharmacological and lifestyle modifications including Zepbound (given h/o OSA) - Emphasized reducing alcohol intake. - Advise reducing alcohol intake. - Patient not too keen on pharmacological intervention due to personal reason. She will let us  know if she changes her mind on this. Will repeat CMP before next office visit.     Essential  hypertension Assessment & Plan: BP well controlled.  Continue losartan  100 mg and hydrochlorothiazide  25 mg daily.     External hemorrhoid Assessment & Plan: She is not interested in surgery unless symptoms worsen. Monitor hemorrhoid symptoms and consider surgery if symptoms worsen.   Acquired hypothyroidism Assessment & Plan: Symptoms stable on levothyroxine  125 mcg daily.  Continue.    Orders: -     TSH; Future  Primary osteoarthritis of right knee Assessment & Plan: Symptoms stable on meloxicam  7.5 mg daily.  Discussed potential future knee replacement surgery. Weight loss could reduce joint stress. Check renal function.   Orders: -     Meloxicam ; TAKE 1 TABLET(7.5 MG) BY MOUTH DAILY as needed  Dispense: 90 tablet; Refill: 1  Need for hepatitis A and B vaccination Assessment & Plan: Twinrix:  First dose: 12/09/23 Second dose: 01/06/24 Third dose: Due after 9/31/2025    Class 3 severe obesity with serious comorbidity and body mass index (BMI) of 40.0 to 44.9 in adult, unspecified obesity type Assessment & Plan:  - Discussed pharmacological and lifestyle modifications including Zepbound (given h/o OSA) - Emphasized reducing alcohol intake. - Advise reducing alcohol intake. - Patient not too keen on pharmacological intervention due to personal reason. She will let us  know if she changes her mind on this. Will repeat CMP before next office visit.    Sleep apnea in adult Assessment & Plan: Long-standing sleep apnea with good CPAP compliance. Continue f/u  with sleep specialist.      Return in about 6 months (around 10/07/2024) for Chronic, labs couple of days before appointment with me. .   Raelee Rossmann, MD

## 2024-04-06 NOTE — Assessment & Plan Note (Signed)
 Long-standing sleep apnea with good CPAP compliance. Continue f/u with sleep specialist.

## 2024-04-06 NOTE — Assessment & Plan Note (Signed)
-   Discussed pharmacological and lifestyle modifications including Zepbound (given h/o OSA) - Emphasized reducing alcohol intake. - Advise reducing alcohol intake. - Patient not too keen on pharmacological intervention due to personal reason. She will let us  know if she changes her mind on this. Will repeat CMP before next office visit.

## 2024-04-06 NOTE — Assessment & Plan Note (Signed)
 Stable on Atorvastatin  40 mg daily, continue, check lipid panel

## 2024-04-06 NOTE — Assessment & Plan Note (Signed)
 She is not interested in surgery unless symptoms worsen. Monitor hemorrhoid symptoms and consider surgery if symptoms worsen.

## 2024-04-06 NOTE — Assessment & Plan Note (Signed)
 Symptoms stable on levothyroxine  125 mcg daily.  Continue.

## 2024-04-06 NOTE — Assessment & Plan Note (Signed)
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  - Weight loss is crucial for liver health.  - Discussed pharmacological and lifestyle modifications including Zepbound (given h/o OSA) - Emphasized reducing alcohol intake. - Advise reducing alcohol intake. - Patient not too keen on pharmacological intervention due to personal reason. She will let us  know if she changes her mind on this. Will repeat CMP before next office visit.

## 2024-04-06 NOTE — Assessment & Plan Note (Signed)
 Twinrix:  First dose: 12/09/23 Second dose: 01/06/24 Third dose: Due after 9/31/2025

## 2024-04-15 LAB — HM DIABETES EYE EXAM

## 2024-04-21 ENCOUNTER — Other Ambulatory Visit: Payer: Self-pay

## 2024-04-21 DIAGNOSIS — E039 Hypothyroidism, unspecified: Secondary | ICD-10-CM

## 2024-04-21 MED ORDER — LEVOTHYROXINE SODIUM 125 MCG PO TABS: 125.0000 ug | ORAL_TABLET | Freq: Every day | ORAL | 1 refills | Status: AC

## 2024-04-25 DIAGNOSIS — G4733 Obstructive sleep apnea (adult) (pediatric): Secondary | ICD-10-CM | POA: Diagnosis not present

## 2024-05-21 DIAGNOSIS — G4733 Obstructive sleep apnea (adult) (pediatric): Secondary | ICD-10-CM | POA: Diagnosis not present

## 2024-05-26 DIAGNOSIS — G4733 Obstructive sleep apnea (adult) (pediatric): Secondary | ICD-10-CM | POA: Diagnosis not present

## 2024-06-04 ENCOUNTER — Ambulatory Visit (INDEPENDENT_AMBULATORY_CARE_PROVIDER_SITE_OTHER): Admitting: Sleep Medicine

## 2024-06-04 VITALS — BP 110/78 | HR 78 | Temp 97.5°F | Ht 67.0 in | Wt 268.0 lb

## 2024-06-04 DIAGNOSIS — I10 Essential (primary) hypertension: Secondary | ICD-10-CM | POA: Diagnosis not present

## 2024-06-04 DIAGNOSIS — Z6841 Body Mass Index (BMI) 40.0 and over, adult: Secondary | ICD-10-CM

## 2024-06-04 DIAGNOSIS — G4733 Obstructive sleep apnea (adult) (pediatric): Secondary | ICD-10-CM | POA: Diagnosis not present

## 2024-06-04 NOTE — Patient Instructions (Addendum)

## 2024-06-04 NOTE — Progress Notes (Signed)
 Name:Annette Valencia MRN: [id number] DOB: 07-17-1966   CHIEF COMPLAINT:  CPAP F/U   HISTORY OF PRESENT ILLNESS: Annette Valencia is a 58 y.o. w/ a h/o OSA, HTN, hypothyroidism, DMII, hyperlipidemia and morbid obesity who present for CPAP F/U visit. Reports using CPAP therapy every night, which is confirmed by compliance data. She is currently using the Airfit N20 nasal mask, which is comfortable. Denies air leaks or nasal congestion. Reports feeling significantly more refreshed upon awakening with CPAP therapy.     EPWORTH SLEEP SCORE    03/12/2024    8:00 AM  Results of the Epworth flowsheet  Sitting and reading 1  Watching TV 1  Sitting, inactive in a public place (e.g. a theatre or a meeting) 0  As a passenger in a car for an hour without a break 1  Lying down to rest in the afternoon when circumstances permit 3  Sitting and talking to someone 0  Sitting quietly after a lunch without alcohol 1  In a car, while stopped for a few minutes in traffic 0  Total score 7    PAST MEDICAL HISTORY :   has a past medical history of Arthritis, Chicken pox, Frequent headaches, Hilar lymphadenopathy (05/04/2018), Hyperlipidemia, Hypertension, Hypothyroidism, Migraines, PONV (postoperative nausea and vomiting), Scoliosis, Sleep apnea, and UTI (urinary tract infection).  has a past surgical history that includes tubes tied (2000); Cesarean section (1988); Cervical ablation (2017); Colonoscopy with propofol  (N/A, 08/06/2017); Cholecystectomy; Tubal ligation; and Colonoscopy with propofol  (N/A, 02/08/2023). Prior to Admission medications   Medication Sig Start Date End Date Taking? Authorizing Provider  atorvastatin  (LIPITOR) 40 MG tablet Take 1 tablet (40 mg total) by mouth daily. 12/09/23   Bair, Kalpana, MD  fluocinonide  (LIDEX ) 0.05 % external solution Apply topically 2 (two) times daily. Patient taking differently: Apply topically 2 (two) times daily as needed. 07/18/23   Webb, Padonda B,  FNP  hydrochlorothiazide  (HYDRODIURIL ) 25 MG tablet Take 1 tablet (25 mg total) by mouth daily. 12/09/23   Bair, Kalpana, MD  levothyroxine  (SYNTHROID ) 125 MCG tablet Take 1 tablet (125 mcg total) by mouth daily. 04/21/24   Bair, Kalpana, MD  losartan  (COZAAR ) 100 MG tablet TAKE 1 TABLET(100 MG) BY MOUTH DAILY 12/09/23   Bair, Kalpana, MD  meloxicam  (MOBIC ) 7.5 MG tablet TAKE 1 TABLET(7.5 MG) BY MOUTH DAILY as needed 04/06/24   Bair, Kalpana, MD  metFORMIN  (GLUCOPHAGE -XR) 500 MG 24 hr tablet Take 1 tablet (500 mg total) by mouth 2 (two) times daily with a meal. 04/06/24   Bair, Kalpana, MD  Multiple Vitamins-Minerals (MULTIVITAMIN WOMEN PO) Take by mouth.    [provider]  pantoprazole  (PROTONIX ) 40 MG tablet Take 1 tablet (40 mg total) by mouth daily. 04/06/24   Bair, Kalpana, MD  VITAMIN D PO Take by mouth daily.    [provider]   No Known Allergies  FAMILY HISTORY:  family history includes ALS in her father; Alcohol abuse in her father; Arthritis in her mother; Hyperlipidemia in her mother; Hypertension in her father and maternal grandmother; Lung cancer in her mother; Stroke in her father. SOCIAL HISTORY:  reports that she quit smoking about 32 years ago. Her smoking use included cigarettes. She has never used smokeless tobacco. She reports current alcohol use of about 10.0 standard drinks of alcohol per week. She reports that she does not use drugs.   Review of Systems:  Gen:  Denies  fever, sweats, chills weight loss  HEENT: Denies blurred  vision, double vision, ear pain, eye pain, hearing loss, nose bleeds, sore throat Cardiac:  No dizziness, chest pain or heaviness, chest tightness,edema, No JVD Resp:   No cough, -sputum production, -shortness of breath,-wheezing, -hemoptysis,  Gi: Denies swallowing difficulty, stomach pain, nausea or vomiting, diarrhea, constipation, bowel incontinence Gu:  Denies bladder incontinence, burning urine Ext:   Denies Joint pain,  stiffness or swelling Skin: Denies  skin rash, easy bruising or bleeding or hives Endoc:  Denies polyuria, polydipsia , polyphagia or weight change Psych:   Denies depression, insomnia or hallucinations  Other:  All other systems negative  VITAL SIGNS: BP 110/78   Pulse 78   Temp (!) 97.5 F (36.4 C)   Ht 5' 7 (1.702 m)   Wt 268 lb (121.6 kg)   SpO2 97%   BMI 41.97 kg/m    Physical Examination:   General Appearance: No distress  EYES PERRLA, EOM intact.   NECK Supple, No JVD Pulmonary: normal breath sounds, No wheezing.  CardiovascularNormal S1,S2.  No m/r/g.   Abdomen: Benign, Soft, non-tender. Skin:   warm, no rashes, no ecchymosis  Extremities: normal, no cyanosis, clubbing. Neuro:without focal findings,  speech normal  PSYCHIATRIC: Mood, affect within normal limits.   ASSESSMENT AND PLAN  OSA Patient is using and benefiting from CPAP therapy. Discussed the consequences of untreated sleep apnea. Advised not to drive drowsy for safety of patient and others. Will follow up in 1 year.    HTN Stable, on current management. Following with PCP.   Morbid obesity Counseled patient on diet and lifestyle modification.    Patient  satisfied with Plan of action and management. All questions answered  I spent a total of 32 minutes reviewing chart data, face-to-face evaluation with the patient, counseling and coordination of care as detailed above.    Annette Valencia, M.D.  Sleep Medicine [facility]

## 2024-06-06 ENCOUNTER — Encounter: Payer: Self-pay | Admitting: Sleep Medicine

## 2024-06-12 ENCOUNTER — Ambulatory Visit: Admitting: Sleep Medicine

## 2024-06-25 DIAGNOSIS — G4733 Obstructive sleep apnea (adult) (pediatric): Secondary | ICD-10-CM | POA: Diagnosis not present

## 2024-08-19 DIAGNOSIS — G4733 Obstructive sleep apnea (adult) (pediatric): Secondary | ICD-10-CM | POA: Diagnosis not present

## 2024-08-25 DIAGNOSIS — G4733 Obstructive sleep apnea (adult) (pediatric): Secondary | ICD-10-CM | POA: Diagnosis not present

## 2024-09-11 ENCOUNTER — Telehealth: Admitting: Physician Assistant

## 2024-09-11 ENCOUNTER — Ambulatory Visit: Payer: Self-pay

## 2024-09-11 DIAGNOSIS — J208 Acute bronchitis due to other specified organisms: Secondary | ICD-10-CM | POA: Diagnosis not present

## 2024-09-11 DIAGNOSIS — B9689 Other specified bacterial agents as the cause of diseases classified elsewhere: Secondary | ICD-10-CM | POA: Diagnosis not present

## 2024-09-11 MED ORDER — IPRATROPIUM BROMIDE 0.03 % NA SOLN: 2.0000 | Freq: Two times a day (BID) | NASAL | 0 refills | Status: AC

## 2024-09-11 MED ORDER — AZITHROMYCIN 250 MG PO TABS: ORAL_TABLET | ORAL | 0 refills | Status: AC

## 2024-09-11 MED ORDER — PROMETHAZINE-DM 6.25-15 MG/5ML PO SYRP: 5.0000 mL | ORAL_SOLUTION | Freq: Four times a day (QID) | ORAL | 0 refills | Status: AC | PRN

## 2024-09-11 MED ORDER — PREDNISONE 20 MG PO TABS: 20.0000 mg | ORAL_TABLET | Freq: Every day | ORAL | 0 refills | Status: AC

## 2024-09-11 NOTE — Progress Notes (Signed)
 " Virtual Visit Consent   Annette Valencia, you are scheduled for a virtual visit with a [facility] provider today. Just as with appointments in the office, your consent must be obtained to participate. Your consent will be active for this visit and any virtual visit you may have with one of our providers in the next 365 days. If you have a MyChart account, a copy of this consent can be sent to you electronically.  As this is a virtual visit, video technology does not allow for your provider to perform a traditional examination. This may limit your provider's ability to fully assess your condition. If your provider identifies any concerns that need to be evaluated in person or the need to arrange testing (such as labs, EKG, etc.), we will make arrangements to do so. Although advances in technology are sophisticated, we cannot ensure that it will always work on either your end or our end. If the connection with a video visit is poor, the visit may have to be switched to a telephone visit. With either a video or telephone visit, we are not always able to ensure that we have a secure connection.  By engaging in this virtual visit, you consent to the provision of healthcare and authorize for your insurance to be billed (if applicable) for the services provided during this visit. Depending on your insurance coverage, you may receive a charge related to this service.  I need to obtain your verbal consent now. Are you willing to proceed with your visit today? Trenice Mesa has provided verbal consent on 09/11/2024 for a virtual visit (video or telephone). [name], PA-C  Date: 09/11/2024 3:30 PM   Virtual Visit via Video Note   I, [name], connected with  Annette Valencia  ([id number], 11/16/1965) on 09/11/2024 at  3:15 PM EST by a video-enabled telemedicine application and verified that I am speaking with the correct person using two identifiers.  Location: Patient: Virtual Visit  Location Patient: Home Provider: Virtual Visit Location Provider: Home Office   I discussed the limitations of evaluation and management by telemedicine and the availability of in person appointments. The patient expressed understanding and agreed to proceed.    History of Present Illness: Annette Valencia is a 59 y.o. who identifies as a female who was assigned female at birth, and is being seen today for cough and congestion.  HPI: URI  This is a new problem. The current episode started in the past 7 days (Sunday, 09/06/24). The problem has been gradually worsening. Maximum temperature: subjective. Associated symptoms include congestion, coughing (minimal productive), diarrhea, ear pain (initial, but improved), headaches, rhinorrhea (post nasal drainage) and a sore throat. Pertinent negatives include no nausea, plugged ear sensation, sinus pain, swollen glands, vomiting or wheezing. Associated symptoms comments: Hoarse voice. Treatments tried: dayquil, nyquil, mucinex cough drops, hot tea. The treatment provided no relief.     Problems:  Patient Active Problem List   Diagnosis Date Noted   Need for hepatitis A and B vaccination 01/06/2024   Seborrheic keratoses, inflamed 12/09/2023   Sleep apnea in adult 12/09/2023   Polyp of colon 02/08/2023   GERD (gastroesophageal reflux disease) 09/02/2019   Elevated LFTs 11/20/2017   Obesity 11/20/2017   External hemorrhoid 07/01/2017   Depression 11/22/2015   Menorrhagia 04/19/2015   Essential hypertension 04/19/2015   Hyperlipidemia 04/19/2015   Hypothyroidism 04/19/2015   Knee osteoarthritis 04/19/2015    Allergies: Allergies[1] Medications: Current Medications[2]  Observations/Objective: Patient is well-developed, well-nourished in  no acute distress.  Resting comfortably at home.  Head is normocephalic, atraumatic.  No labored breathing.  Speech is clear and coherent with logical content.  Patient is alert and oriented at baseline.     Assessment and Plan: 1. Acute bacterial bronchitis (Primary) - promethazine-dextromethorphan (PROMETHAZINE-DM) 6.25-15 MG/5ML syrup; Take 5 mLs by mouth 4 (four) times daily as needed.  Dispense: 118 mL; Refill: 0 - ipratropium (ATROVENT) 0.03 % nasal spray; Place 2 sprays into both nostrils every 12 (twelve) hours.  Dispense: 30 mL; Refill: 0 - predniSONE (DELTASONE) 20 MG tablet; Take 1 tablet (20 mg total) by mouth daily with breakfast.  Dispense: 5 tablet; Refill: 0 - azithromycin  (ZITHROMAX ) 250 MG tablet; Take 2 tablets on day 1, then 1 tablet daily on days 2 through 5  Dispense: 6 tablet; Refill: 0  - Worsening over a week despite OTC medications - Will treat with Z-pack, Prednisone, Promethazine DM (nighttime), and Ipratropium bromide nasal spray for drainage - Can add Mucinex (PLAIN) during the daytime - Push fluids.  - Rest.  - Steam and humidifier can help - Seek in person evaluation if worsening or symptoms fail to improve    Follow Up Instructions: I discussed the assessment and treatment plan with the patient. The patient was provided an opportunity to ask questions and all were answered. The patient agreed with the plan and demonstrated an understanding of the instructions.  A copy of instructions were sent to the patient via MyChart unless otherwise noted below.    The patient was advised to call back or seek an in-person evaluation if the symptoms worsen or if the condition fails to improve as anticipated.    Delon HERO Aeson Sawyers, PA-C     [1] No Known Allergies [2]  Current Outpatient Medications:    azithromycin  (ZITHROMAX ) 250 MG tablet, Take 2 tablets on day 1, then 1 tablet daily on days 2 through 5, Disp: 6 tablet, Rfl: 0   ipratropium (ATROVENT) 0.03 % nasal spray, Place 2 sprays into both nostrils every 12 (twelve) hours., Disp: 30 mL, Rfl: 0   predniSONE (DELTASONE) 20 MG tablet, Take 1 tablet (20 mg total) by mouth daily with breakfast., Disp: 5 tablet,  Rfl: 0   promethazine-dextromethorphan (PROMETHAZINE-DM) 6.25-15 MG/5ML syrup, Take 5 mLs by mouth 4 (four) times daily as needed., Disp: 118 mL, Rfl: 0   atorvastatin  (LIPITOR) 40 MG tablet, Take 1 tablet (40 mg total) by mouth daily., Disp: 90 tablet, Rfl: 3   fluocinonide  (LIDEX ) 0.05 % external solution, Apply topically 2 (two) times daily. (Patient taking differently: Apply topically 2 (two) times daily as needed.), Disp: 60 mL, Rfl: 2   hydrochlorothiazide  (HYDRODIURIL ) 25 MG tablet, Take 1 tablet (25 mg total) by mouth daily., Disp: 90 tablet, Rfl: 3   levothyroxine  (SYNTHROID ) 125 MCG tablet, Take 1 tablet (125 mcg total) by mouth daily., Disp: 90 tablet, Rfl: 1   losartan  (COZAAR ) 100 MG tablet, TAKE 1 TABLET(100 MG) BY MOUTH DAILY, Disp: 90 tablet, Rfl: 3   meloxicam  (MOBIC ) 7.5 MG tablet, TAKE 1 TABLET(7.5 MG) BY MOUTH DAILY as needed, Disp: 90 tablet, Rfl: 1   metFORMIN  (GLUCOPHAGE -XR) 500 MG 24 hr tablet, Take 1 tablet (500 mg total) by mouth 2 (two) times daily with a meal., Disp: 180 tablet, Rfl: 3   Multiple Vitamins-Minerals (MULTIVITAMIN WOMEN PO), Take by mouth., Disp: , Rfl:    pantoprazole  (PROTONIX ) 40 MG tablet, Take 1 tablet (40 mg total) by mouth daily., Disp: 90 tablet, Rfl:  1   VITAMIN D PO, Take by mouth daily., Disp: , Rfl:   "

## 2024-09-11 NOTE — Telephone Encounter (Signed)
 FYI Only or Action Required?: FYI only for provider: appointment scheduled on virtual UC 09/11/24.  Patient was last seen in primary care on 04/06/2024 by Bair, Kalpana, MD.  Called Nurse Triage reporting Sore Throat and Cough.  Symptoms began several days ago.  Interventions attempted: OTC medications: DayQuil and NyQuil.  Symptoms are: unchanged.  Triage Disposition: See Physician Within 24 Hours  Patient/caregiver understands and will follow disposition?: Yes                                  1. ONSET: When did the cough begin?      Sunday/Monday of this week 2. SEVERITY: How bad is the cough today?      Moderate to severe, states cough is preventing her from sleeping 3. SPUTUM: Describe the color of your sputum (e.g., none, dry cough; clear, white, yellow, green)     Yellow/green 4. HEMOPTYSIS: Are you coughing up any blood? If Yes, ask: How much? (e.g., flecks, streaks, tablespoons, etc.)     Denies 5. DIFFICULTY BREATHING: Are you having difficulty breathing? If Yes, ask: How bad is it? (e.g., mild, moderate, severe)      Denies outside of coughing spells, patient able to speak in clear and complete sentences while on phone with this RN 6. FEVER: Do you have a fever? If Yes, ask: What is your temperature, how was it measured, and when did it start?     Denies at this time 7. CARDIAC HISTORY: Do you have any history of heart disease? (e.g., heart attack, congestive heart failure)      Patient denies 8. LUNG HISTORY: Do you have any history of lung disease?  (e.g., pulmonary embolus, asthma, emphysema)     States she wears a CPAP at night 9. PE RISK FACTORS: Do you have a history of blood clots? (or: recent major surgery, recent prolonged travel, bedridden)     Denies history of blood clots  10. OTHER SYMPTOMS: Do you have any other symptoms? (e.g., runny nose, wheezing, chest pain)     Denies chest pain, denies wheezing,  denies difficulty swallowing    This RN advised evaluation today. No availability at PCP office or surrounding offices within region. This RN scheduled patient for virtual UC appointment this afternoon.   Copied from CRM #[id number]. Topic: Clinical - Red Word Triage >> Sep 11, 2024  8:35 AM Suzen RAMAN wrote: Red Word that prompted transfer to Nurse Triage: sore throat,severe cough, can't sleep when laying down there is an inrease in mucous(Greenish) and patient begins to gag while laying down, requesting an appt  Reason for Disposition  [1] Continuous (nonstop) coughing interferes with work or school AND [2] no improvement using cough treatment per Care Advice  Protocols used: Cough - Acute Productive-A-AH

## 2024-09-11 NOTE — Patient Instructions (Signed)
 " Annette Valencia, thank you for joining Annette CHRISTELLA Dickinson, PA-C for today's virtual visit.  While this provider is not your primary care provider (PCP), if your PCP is located in our provider database this encounter information will be shared with them immediately following your visit.   A [facility] MyChart account gives you access to today's visit and all your visits, tests, and labs performed at [facility]  click here if you don't have a [facility] MyChart account or go to [email or web address]  Consent: (Patient) Annette Valencia provided verbal consent for this virtual visit at the beginning of the encounter.  Current Medications:  Current Outpatient Medications:    azithromycin  (ZITHROMAX ) 250 MG tablet, Take 2 tablets on day 1, then 1 tablet daily on days 2 through 5, Disp: 6 tablet, Rfl: 0   ipratropium (ATROVENT) 0.03 % nasal spray, Place 2 sprays into both nostrils every 12 (twelve) hours., Disp: 30 mL, Rfl: 0   predniSONE (DELTASONE) 20 MG tablet, Take 1 tablet (20 mg total) by mouth daily with breakfast., Disp: 5 tablet, Rfl: 0   promethazine-dextromethorphan (PROMETHAZINE-DM) 6.25-15 MG/5ML syrup, Take 5 mLs by mouth 4 (four) times daily as needed., Disp: 118 mL, Rfl: 0   atorvastatin  (LIPITOR) 40 MG tablet, Take 1 tablet (40 mg total) by mouth daily., Disp: 90 tablet, Rfl: 3   fluocinonide  (LIDEX ) 0.05 % external solution, Apply topically 2 (two) times daily. (Patient taking differently: Apply topically 2 (two) times daily as needed.), Disp: 60 mL, Rfl: 2   hydrochlorothiazide  (HYDRODIURIL ) 25 MG tablet, Take 1 tablet (25 mg total) by mouth daily., Disp: 90 tablet, Rfl: 3   levothyroxine  (SYNTHROID ) 125 MCG tablet, Take 1 tablet (125 mcg total) by mouth daily., Disp: 90 tablet, Rfl: 1   losartan  (COZAAR ) 100 MG tablet, TAKE 1 TABLET(100 MG) BY MOUTH DAILY, Disp: 90 tablet, Rfl: 3   meloxicam  (MOBIC ) 7.5 MG tablet, TAKE 1 TABLET(7.5 MG) BY MOUTH DAILY as  needed, Disp: 90 tablet, Rfl: 1   metFORMIN  (GLUCOPHAGE -XR) 500 MG 24 hr tablet, Take 1 tablet (500 mg total) by mouth 2 (two) times daily with a meal., Disp: 180 tablet, Rfl: 3   Multiple Vitamins-Minerals (MULTIVITAMIN WOMEN PO), Take by mouth., Disp: , Rfl:    pantoprazole  (PROTONIX ) 40 MG tablet, Take 1 tablet (40 mg total) by mouth daily., Disp: 90 tablet, Rfl: 1   VITAMIN D PO, Take by mouth daily., Disp: , Rfl:    Medications ordered in this encounter:  Meds ordered this encounter  Medications   promethazine-dextromethorphan (PROMETHAZINE-DM) 6.25-15 MG/5ML syrup    Sig: Take 5 mLs by mouth 4 (four) times daily as needed.    Dispense:  118 mL    Refill:  0    Supervising Provider:   BLAISE ALEENE KIDD [[id number]]   ipratropium (ATROVENT) 0.03 % nasal spray    Sig: Place 2 sprays into both nostrils every 12 (twelve) hours.    Dispense:  30 mL    Refill:  0    Supervising Provider:   BLAISE ALEENE KIDD [[id number]]   predniSONE (DELTASONE) 20 MG tablet    Sig: Take 1 tablet (20 mg total) by mouth daily with breakfast.    Dispense:  5 tablet    Refill:  0    Supervising Provider:   LAMPTEY, PHILIP O [[id number]]   azithromycin  (ZITHROMAX ) 250 MG tablet    Sig: Take 2 tablets on day 1, then 1 tablet daily on days 2 through 5  Dispense:  6 tablet    Refill:  0    Supervising Provider:   LAMPTEY, PHILIP O [[id number]]     *If you need refills on other medications prior to your next appointment, please contact your pharmacy*  Follow-Up: Call back or seek an in-person evaluation if the symptoms worsen or if the condition fails to improve as anticipated.  [facility] [phone]  Other Instructions Acute Bronchitis, Adult  Acute bronchitis is sudden inflammation of the main airways (bronchi) that come off the windpipe (trachea) in the lungs. The swelling causes the airways to get smaller and make more mucus than normal. This can make it hard to breathe and can cause  coughing or noisy breathing (wheezing). Acute bronchitis may last several weeks. The cough may last longer. Allergies, asthma, and exposure to smoke may make the condition worse. What are the causes? This condition can be caused by germs and by substances that irritate the lungs, including: Cold and flu viruses. The most common cause of this condition is the virus that causes the common cold. Bacteria. This is less common. Breathing in substances that irritate the lungs, including: Smoke from cigarettes and other forms of tobacco. Dust and pollen. Fumes from household cleaning products, gases, or burned fuel. Indoor or outdoor air pollution. What increases the risk? The following factors may make you more likely to develop this condition: A weak body's defense system, also called the immune system. A condition that affects your lungs and breathing, such as asthma. What are the signs or symptoms? Common symptoms of this condition include: Coughing. This may bring up clear, yellow, or green mucus from your lungs (sputum). Wheezing. Runny or stuffy nose. Having too much mucus in your lungs (chest congestion). Shortness of breath. Aches and pains, including sore throat or chest. How is this diagnosed? This condition is usually diagnosed based on: Your symptoms and medical history. A physical exam. You may also have other tests, including tests to rule out other conditions, such as pneumonia. These tests include: A test of lung function. Test of a mucus sample to look for the presence of bacteria. Tests to check the oxygen level in your blood. Blood tests. Chest X-ray. How is this treated? Most cases of acute bronchitis clear up over time without treatment. Your health care provider may recommend: Drinking more fluids to help thin your mucus so it is easier to cough up. Taking inhaled medicine (inhaler) to improve air flow in and out of your lungs. Using a vaporizer or a humidifier.  These are machines that add water  to the air to help you breathe better. Taking a medicine that thins mucus and clears congestion (expectorant). Taking a medicine that prevents or stops coughing (cough suppressant). It is not common to take an antibiotic medicine for this condition. Follow these instructions at home:  Take over-the-counter and prescription medicines only as told by your health care provider. Use an inhaler, vaporizer, or humidifier as told by your health care provider. Take two teaspoons (10 mL) of honey at bedtime to lessen coughing at night. Drink enough fluid to keep your urine pale yellow. Do not use any products that contain nicotine or tobacco. These products include cigarettes, chewing tobacco, and vaping devices, such as e-cigarettes. If you need help quitting, ask your health care provider. Get plenty of rest. Return to your normal activities as told by your health care provider. Ask your health care provider what activities are safe for you. Keep all follow-up  visits. This is important. How is this prevented? To lower your risk of getting this condition again: Wash your hands often with soap and water  for at least 20 seconds. If soap and water  are not available, use hand sanitizer. Avoid contact with people who have cold symptoms. Try not to touch your mouth, nose, or eyes with your hands. Avoid breathing in smoke or chemical fumes. Breathing smoke or chemical fumes will make your condition worse. Get the flu shot every year. Contact a health care provider if: Your symptoms do not improve after 2 weeks. You have trouble coughing up the mucus. Your cough keeps you awake at night. You have a fever. Get help right away if you: Cough up blood. Feel pain in your chest. Have severe shortness of breath. Faint or keep feeling like you are going to faint. Have a severe headache. Have a fever or chills that get worse. These symptoms may represent a serious problem  that is an emergency. Do not wait to see if the symptoms will go away. Get medical help right away. Call your local emergency services (911 in the [place] Do not drive yourself to the hospital. Summary Acute bronchitis is inflammation of the main airways (bronchi) that come off the windpipe (trachea) in the lungs. The swelling causes the airways to get smaller and make more mucus than normal. Drinking more fluids can help thin your mucus so it is easier to cough up. Take over-the-counter and prescription medicines only as told by your health care provider. Do not use any products that contain nicotine or tobacco. These products include cigarettes, chewing tobacco, and vaping devices, such as e-cigarettes. If you need help quitting, ask your health care provider. Contact a health care provider if your symptoms do not improve after 2 weeks. This information is not intended to replace advice given to you by your health care provider. Make sure you discuss any questions you have with your health care provider. Document Revised: 12/07/2021 Document Reviewed: 12/28/2020 Elsevier Patient Education  2024 [employer]   If you have been instructed to have an in-person evaluation today at a local Urgent Care facility, please use the link below. It will take you to a list of all of our available [facility], including address, phone number and hours of operation. Please do not delay care.  [facility]  If you or a family member do not have a primary care provider, use the link below to schedule a visit and establish care. When you choose a [facility] primary care physician or advanced practice provider, you gain a long-term partner in health. Find a Primary Care Provider  Learn more about [facility]'s in-office and virtual care options: [facility] Get Care Now "

## 2024-10-06 ENCOUNTER — Other Ambulatory Visit

## 2024-10-07 ENCOUNTER — Other Ambulatory Visit: Payer: Self-pay

## 2024-10-07 DIAGNOSIS — M1711 Unilateral primary osteoarthritis, right knee: Secondary | ICD-10-CM

## 2024-10-08 ENCOUNTER — Ambulatory Visit

## 2024-10-16 ENCOUNTER — Other Ambulatory Visit

## 2024-10-16 DIAGNOSIS — E782 Mixed hyperlipidemia: Secondary | ICD-10-CM

## 2024-10-16 DIAGNOSIS — E1165 Type 2 diabetes mellitus with hyperglycemia: Secondary | ICD-10-CM

## 2024-10-16 DIAGNOSIS — E039 Hypothyroidism, unspecified: Secondary | ICD-10-CM

## 2024-10-16 DIAGNOSIS — K219 Gastro-esophageal reflux disease without esophagitis: Secondary | ICD-10-CM

## 2024-10-16 DIAGNOSIS — R7401 Elevation of levels of liver transaminase levels: Secondary | ICD-10-CM

## 2024-10-16 LAB — TSH: TSH: 1.64 u[IU]/mL (ref 0.35–5.50)

## 2024-10-16 LAB — COMPREHENSIVE METABOLIC PANEL WITH GFR
ALT: 69 U/L — ABNORMAL HIGH (ref 3–35)
AST: 46 U/L — ABNORMAL HIGH (ref 5–37)
Albumin: 4.2 g/dL (ref 3.5–5.2)
Alkaline Phosphatase: 153 U/L — ABNORMAL HIGH (ref 39–117)
BUN: 12 mg/dL (ref 6–23)
CO2: 32 meq/L (ref 19–32)
Calcium: 10.1 mg/dL (ref 8.4–10.5)
Chloride: 100 meq/L (ref 96–112)
Creatinine, Ser: 0.87 mg/dL (ref 0.40–1.20)
GFR: 72.77 mL/min
Glucose, Bld: 144 mg/dL — ABNORMAL HIGH (ref 70–99)
Potassium: 4 meq/L (ref 3.5–5.1)
Sodium: 142 meq/L (ref 135–145)
Total Bilirubin: 0.7 mg/dL (ref 0.2–1.2)
Total Protein: 6.6 g/dL (ref 6.0–8.3)

## 2024-10-16 LAB — LIPID PANEL
Cholesterol: 180 mg/dL (ref 28–200)
HDL: 59.2 mg/dL
LDL Cholesterol: 95 mg/dL (ref 10–99)
NonHDL: 120.73
Total CHOL/HDL Ratio: 3
Triglycerides: 127 mg/dL (ref 10.0–149.0)
VLDL: 25.4 mg/dL (ref 0.0–40.0)

## 2024-10-16 LAB — HEMOGLOBIN A1C: Hgb A1c MFr Bld: 6.8 % — ABNORMAL HIGH (ref 4.6–6.5)

## 2024-10-16 LAB — VITAMIN B12: Vitamin B-12: 1226 pg/mL — ABNORMAL HIGH (ref 211–911)

## 2024-10-20 ENCOUNTER — Ambulatory Visit
